# Patient Record
Sex: Male | Born: 1995 | Race: Black or African American | Hispanic: No | Marital: Single | Smoking: Current every day smoker
Health system: Southern US, Community
[De-identification: ages and names within clinical notes are randomized; demographics above are authoritative.]

## PROBLEM LIST (undated history)

## (undated) DIAGNOSIS — K029 Dental caries, unspecified: Secondary | ICD-10-CM

---
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
## 2016-07-08 ENCOUNTER — Encounter: Payer: Self-pay | Admitting: Emergency Medicine

## 2016-07-08 ENCOUNTER — Emergency Department: Payer: Self-pay

## 2016-07-08 ENCOUNTER — Emergency Department
Admission: EM | Admit: 2016-07-08 | Discharge: 2016-07-08 | Disposition: A | Discharge status: Home / Self Care | Payer: Self-pay | Attending: Emergency Medicine | Admitting: Emergency Medicine

## 2016-07-08 DIAGNOSIS — F17200 Nicotine dependence, unspecified, uncomplicated: Secondary | ICD-10-CM | POA: Insufficient documentation

## 2016-07-08 DIAGNOSIS — Y9367 Activity, basketball: Secondary | ICD-10-CM | POA: Insufficient documentation

## 2016-07-08 DIAGNOSIS — W2105XA Struck by basketball, initial encounter: Secondary | ICD-10-CM | POA: Insufficient documentation

## 2016-07-08 DIAGNOSIS — Y92310 Basketball court as the place of occurrence of the external cause: Secondary | ICD-10-CM | POA: Insufficient documentation

## 2016-07-08 DIAGNOSIS — L84 Corns and callosities: Secondary | ICD-10-CM | POA: Insufficient documentation

## 2016-07-08 DIAGNOSIS — S6991XA Unspecified injury of right wrist, hand and finger(s), initial encounter: Secondary | ICD-10-CM | POA: Insufficient documentation

## 2016-07-08 DIAGNOSIS — Y999 Unspecified external cause status: Secondary | ICD-10-CM | POA: Insufficient documentation

## 2016-07-08 MED ORDER — MELOXICAM 15 MG PO TABS: 15.0000 mg | ORAL_TABLET | Freq: Every day | ORAL | 0 refills | Status: DC

## 2016-07-08 NOTE — ED Provider Notes (Signed)
[facility] Emergency Department Provider Note  ____________________________________________  Time seen: Approximately 5:05 PM  I have reviewed the triage vital signs and the nursing notes.   HISTORY  Chief Complaint Finger Injury    HPI Jeffrey Parrish is a 20 y.o. male who presents emergency department complaining of significant pain right hand. Patient states that he was playing basketball 2 weeks prior when his finger was jammed. Patient reports that his PIP joint has been swollen since time of injury. Patient denies any pain. He endorses full range of motion. Patient is also concerned due to a "knot" on the lateral aspect of the nailbed to the third digit right hand. Patient reports that this is been present for "weeks." Patient denies any pain to area. He denies any erythema. He denies any drainage from site.   History reviewed. No pertinent past medical history.  There are no active problems to display for this patient.   History reviewed. No pertinent surgical history.  Prior to Admission medications   Medication Sig Start Date End Date Taking? Authorizing Provider  meloxicam (MOBIC) 15 MG tablet Take 1 tablet (15 mg total) by mouth daily. 07/08/16   Delorise RoyalsJonathan D Jaylun Fleener, PA-C    Allergies Review of patient's allergies indicates no known allergies.  No family history on file.  Social History Social History  Substance Use Topics  . Smoking status: Current Every Day Smoker  . Smokeless tobacco: Never Used  . Alcohol use No     Review of Systems  Constitutional: No fever/chills Cardiovascular: no chest pain. Respiratory: no cough. No SOB. Musculoskeletal: Positive for enlarged PIP joint second digit right hand. Skin: Negative for rash, abrasions, lacerations, ecchymosis. Positive for "skin knot" to lateral aspect of the nailbed and third digit right hand Neurological: Negative for headaches, focal weakness or numbness. 10-point ROS  otherwise negative.  ____________________________________________   PHYSICAL EXAM:  VITAL SIGNS: ED Triage Vitals  Enc Vitals Group     BP 07/08/16 1643 129/65     Pulse Rate 07/08/16 1643 (!) 58     Resp 07/08/16 1643 18     Temp 07/08/16 1643 97.8 F (36.6 C)     Temp Source 07/08/16 1643 Oral     SpO2 07/08/16 1643 100 %     Weight 07/08/16 1644 125 lb (56.7 kg)     Height 07/08/16 1644 5\' 9"  (1.753 m)     Head Circumference --      Peak Flow --      Pain Score 07/08/16 1648 3     Pain Loc --      Pain Edu? --      Excl. in GC? --      Constitutional: Alert and oriented. Well appearing and in no acute distress. Eyes: Conjunctivae are normal. PERRL. EOMI. Head: Atraumatic. Cardiovascular: Normal rate, regular rhythm. Normal S1 and S2.  Good peripheral circulation. Respiratory: Normal respiratory effort without tachypnea or retractions. Lungs CTAB. Good air entry to the bases with no decreased or absent breath sounds. Musculoskeletal: Full range of motion to all extremities. Visualization of the right hand reveals a edematous PIP joint second digit right hand. Full range of motion. No palpable abnormality to the osseous structures. No tenderness to palpation. Sensation and cap refill intact distal aspect of the finger. No other osseous abnormality identified to the right hand. Neurologic:  Normal speech and language. No gross focal neurologic deficits are appreciated.  Skin:  Skin is warm, dry and intact. No rash  noted. Patient has skin thickening to the lateral aspect of the nailbed third digit right hand. This is consistent with callus formation. No signs of infection with erythema, warmth, drainage. Area is nontender to palpation. Psychiatric: Mood and affect are normal. Speech and behavior are normal. Patient exhibits appropriate insight and judgement.   ____________________________________________   LABS (all labs ordered are listed, but only abnormal results are  displayed)  Labs Reviewed - No data to display ____________________________________________  EKG   ____________________________________________  RADIOLOGY Festus BarrenI, Michaelyn Wall D Kissy Cielo, personally viewed and evaluated these images (plain radiographs) as part of my medical decision making, as well as reviewing the written report by the radiologist.  Dg Hand Complete Right  Result Date: 07/08/2016 CLINICAL DATA:  Jammed right index finger playing football last week. Ingrown nail at the middle finger. EXAM: RIGHT HAND - COMPLETE 3+ VIEW COMPARISON:  None. FINDINGS: Soft tissue swelling is present at the PIP joint of the index finger. The joint is located. There is no underlying fracture or foreign body. Focal soft tissue swelling is noted along the ulnar aspect of the distal middle finger of the nail bed. There may be a soft tissue laceration with focal lucency. This could represent air underneath the cuticle. No associated osseous lesion is present. The hand is otherwise unremarkable. IMPRESSION: 1. Soft swelling the PIP joint of the index finger without underlying fracture or dislocation. 2. Focal soft tissue swelling in the distal middle finger without associated osseous abnormality. Electronically Signed   By: Marin Robertshristopher  Mattern M.D.   On: 07/08/2016 17:56    ____________________________________________    PROCEDURES  Procedure(s) performed:    Procedures    Medications - No data to display   ____________________________________________   INITIAL IMPRESSION / ASSESSMENT AND PLAN / ED COURSE  Pertinent labs & imaging results that were available during my care of the patient were reviewed by me and considered in my medical decision making (see chart for details).  Clinical Course    Patient's diagnosis is consistent with Finger injury from 2 weeks prior. X-ray reveals no acute osseous abnormality. PIP joint to the second digit is enlarged but is nonpainful and does not strict  motion. Sensation and cap refill intact distally. Patient will be placed on anti-inflammatories for reduction of edema. If after 2 weeks there is no improvement patient will follow-up with orthopedics. Patient also has what appears to be a callus to the lateral aspect of the nailbed third digit of the right hand. No signs of infection. Patient is encouraged to use moisturizing lotion or Mentor collagen breakdown to improve the symptoms..  Patient is given ED precautions to return to the ED for any worsening or new symptoms.     ____________________________________________  FINAL CLINICAL IMPRESSION(S) / ED DIAGNOSES  Final diagnoses:  Finger injury, right, initial encounter  Skin callus      NEW MEDICATIONS STARTED DURING THIS VISIT:  New Prescriptions   MELOXICAM (MOBIC) 15 MG TABLET    Take 1 tablet (15 mg total) by mouth daily.        This chart was dictated using voice recognition software/Dragon. Despite best efforts to proofread, errors can occur which can change the meaning. Any change was purely unintentional.    Racheal PatchesJonathan D Lainy Wrobleski, PA-C 07/08/16 1849    Minna AntisKevin Paduchowski, MD 07/08/16 [phone]

## 2016-07-08 NOTE — ED Triage Notes (Signed)
States he jammed right index finger while playing football  And also has a poss. Ingrown nail to middle finger

## 2016-07-20 ENCOUNTER — Emergency Department
Admission: EM | Admit: 2016-07-20 | Discharge: 2016-07-20 | Disposition: A | Discharge status: Home / Self Care | Payer: Self-pay | Attending: Emergency Medicine | Admitting: Emergency Medicine

## 2016-07-20 ENCOUNTER — Encounter: Payer: Self-pay | Admitting: Emergency Medicine

## 2016-07-20 DIAGNOSIS — F17210 Nicotine dependence, cigarettes, uncomplicated: Secondary | ICD-10-CM | POA: Insufficient documentation

## 2016-07-20 DIAGNOSIS — K0889 Other specified disorders of teeth and supporting structures: Secondary | ICD-10-CM | POA: Insufficient documentation

## 2016-07-20 MED ORDER — IBUPROFEN 600 MG PO TABS
600.0000 mg | ORAL_TABLET | Freq: Three times a day (TID) | ORAL | 0 refills | Status: DC | PRN
Start: 2016-07-20 — End: 2016-07-23

## 2016-07-20 MED ORDER — AMOXICILLIN 500 MG PO CAPS: 500.0000 mg | ORAL_CAPSULE | Freq: Three times a day (TID) | ORAL | 0 refills | Status: DC

## 2016-07-20 MED ORDER — CEFTRIAXONE SODIUM 1 G IJ SOLR
1.0000 g | Freq: Once | INTRAMUSCULAR | Status: AC
  Administered 2016-07-20: 1 g via INTRAMUSCULAR
  Filled 2016-07-20: qty 10

## 2016-07-20 NOTE — Discharge Instructions (Signed)
Follow up with one of the dental clinics listed on your papers.  Call Monday for appointment.  [facility] does not require an appointment and information is given to you also. Take Amoxil and ibuprofen. Continue fluids and soft foods as tolerated. It does appear that you're wisdom tooth is protruding from the gum which may add to your dental discomfort.  OPTIONS FOR DENTAL FOLLOW UP CARE  [place] Department of Health and Human Services - Local Safety Net Dental Clinics [email or web address]   [facility] [phone])  Sharl Ma [phone])  [place] [phone])  [facility] [phone])  [facility] [phone]) This clinic caters to the indigent population and is on a lottery system. Location: [employer] [facility], [employer], [address] [place] Clinic Hours: Wednesdays from 6pm - 9pm, patients seen by a lottery system. For dates, call or go to [email or web address] Services: Cleanings, fillings and simple extractions. Payment Options: DENTAL WORK IS FREE OF CHARGE. Bring proof of income or support. Best way to get seen: Arrive at 5:15 pm - this is a lottery, NOT first come/first serve, so arriving earlier will not increase your chances of being seen.     [facility] [phone] Select option 1 for emergencies   Location: [facility], [place], [address], [place] Clinic Hours: No walk-ins accepted - call the day before to schedule an appointment. Check in times are 9:30 am and 1:30 pm. Services: Simple extractions, temporary fillings, pulpectomy/pulp debridement, uncomplicated abscess drainage. Payment Options: PAYMENT IS DUE AT THE TIME OF SERVICE.  Fee is usually $100-200, additional surgical procedures (e.g. abscess drainage) may be extra. Cash, checks,  Visa/MasterCard accepted.  Can file Medicaid if patient is covered for dental - patient should call case worker to check. No discount for [facility] [place] patients. Best way to get seen: MUST call the day before and get onto the schedule. Can usually be seen the next 1-2 days. No walk-ins accepted.     [facility] [phone]   Location: [facility], [address], [place] Clinic Hours: M, W, Th, F 8am or 1:30pm, Tues 9a or 1:30 - first come/first served. Services: Simple extractions, temporary fillings, uncomplicated abscess drainage.  You do not need to be an [facility] resident. Payment Options: PAYMENT IS DUE AT THE TIME OF SERVICE. Dental insurance, otherwise sliding scale - bring proof of income or support. Depending on income and treatment needed, cost is usually $50-200. Best way to get seen: Arrive early as it is first come/first served.     [facility] [phone]   Location: [address] Clinic Hours: Mon-Thu 8a-5p Services: Most basic dental services including extractions and fillings. Payment Options: PAYMENT IS DUE AT THE TIME OF SERVICE. Sliding scale, up to 50% off - bring proof if income or support. Medicaid with dental option accepted. Best way to get seen: Call to schedule an appointment, can usually be seen within 2 weeks OR they will try to see walk-ins - show up at 8a or 2p (you may have to wait).     [facility] [phone] [place] RESIDENTS ONLY   Location: [facility], [address], [place], [place] [address] Clinic Hours: By appointment only. Monday - Thursday 8am-5pm, Friday 8am-12pm Services: Cleanings, fillings, extractions. Payment Options: PAYMENT IS DUE AT THE TIME OF SERVICE. Cash, Visa or MasterCard. Sliding  scale - $30 minimum per service. Best way to get seen: Come in to office, complete packet and  make an appointment - need proof of income or support monies for each household member and proof of [place] residence. Usually takes about a month to get in.     [facility] [phone]   Location: [address] [facility] Hours: Walk-in Urgent Care Dental Services are offered Monday-Friday mornings only. The numbers of emergencies accepted daily is limited to the number of providers available. Maximum 15 - Mondays, Wednesdays & Thursdays Maximum 10 - Tuesdays & Fridays Services: You do not need to be [place] resident to be seen for a dental emergency. Emergencies are defined as pain, swelling, abnormal bleeding, or dental trauma. Walkins will receive x-rays if needed. NOTE: Dental cleaning is not an emergency. Payment Options: PAYMENT IS DUE AT THE TIME OF SERVICE. Minimum co-pay is $40.00 for uninsured patients. Minimum co-pay is $3.00 for Medicaid with dental coverage. Dental Insurance is accepted and must be presented at time of visit. Medicare does not cover dental. Forms of payment: Cash, credit card, checks. Best way to get seen: If not previously registered with the clinic, walk-in dental registration begins at 7:15 am and is on a first come/first serve basis. If previously registered with the clinic, call to make an appointment.     [facility] [phone] [place] RESIDENTS ONLY   Location: [address], [place], [place] Clinic Hours: Mon-Thu 10a-2p Services: Extractions only! Payment Options: FREE (donations accepted) - bring proof of income or support Best way to get seen: Call and schedule an appointment OR come at 8am on the 1st Monday of every month (except for holidays) when it is first come/first served.     [facility] [phone]   Location: [address], [place] Clinic Hours: Friday mornings Services, Payment Options, Best way to get seen: Call for info

## 2016-07-20 NOTE — ED Triage Notes (Signed)
Patient from home via POV with complaint of left lower dental pain (wisdom tooth).

## 2016-07-20 NOTE — ED Provider Notes (Signed)
[facility] Emergency Department Provider Note   ____________________________________________   First MD Initiated Contact with Patient 07/20/16 1157     (approximate)  I have reviewed the triage vital signs and the nursing notes.   HISTORY  Chief Complaint Dental Pain   HPI Jeffrey Parrish is a 20 y.o. male 0 complaint of left lower dental pain that started yesterday. Patient has taken Tylenol for his pain. Currently he is taking an anti-inflammatory and a antibiotic for a another injury. Patient is unaware of what this antibiotic is. Patient currently does not have a dentist. Denies any fever or chills. Patient is able to drink fluids, take his medication and talk. He is unable to open his mouth completely due to dental pain. He rates his pain as an 8 out of 10.   History reviewed. No pertinent past medical history.  There are no active problems to display for this patient.   History reviewed. No pertinent surgical history.  Prior to Admission medications   Medication Sig Start Date End Date Taking? Authorizing Provider  amoxicillin (AMOXIL) 500 MG capsule Take 1 capsule (500 mg total) by mouth 3 (three) times daily. 07/20/16   Tommi Rumpshonda L Herbert Aguinaldo, PA-C  ibuprofen (ADVIL,MOTRIN) 600 MG tablet Take 1 tablet (600 mg total) by mouth every 8 (eight) hours as needed. 07/20/16   Tommi Rumpshonda L Gredmarie Delange, PA-C  meloxicam (MOBIC) 15 MG tablet Take 1 tablet (15 mg total) by mouth daily. 07/08/16   Delorise RoyalsJonathan D Cuthriell, PA-C    Allergies Review of patient's allergies indicates no known allergies.  History reviewed. No pertinent family history.  Social History Social History  Substance Use Topics  . Smoking status: Current Every Day Smoker  . Smokeless tobacco: Never Used  . Alcohol use No    Review of Systems Constitutional: No fever/chills ENT: Positive dental pain. Cardiovascular: Denies chest pain. Respiratory: Denies shortness of breath. Gastrointestinal:   No nausea, no vomiting.   Musculoskeletal: Negative for back pain. Skin: Negative for rash. Neurological: Negative for headaches, focal weakness or numbness.  10-point ROS otherwise negative.  ____________________________________________   PHYSICAL EXAM:  VITAL SIGNS: ED Triage Vitals [07/20/16 1132]  Enc Vitals Group     BP (!) 108/59     Pulse      Resp 18     Temp 98.9 F (37.2 C)     Temp src      SpO2 100 %     Weight 125 lb (56.7 kg)     Height 5\' 9"  (1.753 m)     Head Circumference      Peak Flow      Pain Score 8     Pain Loc      Pain Edu?      Excl. in GC?     Constitutional: Alert and oriented. Well appearing and in no acute distress. Eyes: Conjunctivae are normal. PERRL. EOMI. Head: Atraumatic. Nose: No congestion/rhinnorhea. Mouth/Throat: Mucous membranes are moist.  Oropharynx non-erythematous.Patient is able to open his mouth enough that I can see that on the left mandible there is interruption that appears to be his wisdom tooth. There is moderate tenderness on palpation. Posterior pharynx no erythema or exudate was seen. Patient has no difficulty with swallowing. Gum is moderately tender to palpation with a tongue depressor. Neck: No stridor.   Hematological/Lymphatic/Immunilogical: No cervical lymphadenopathy. Cardiovascular: Normal rate, regular rhythm. Grossly normal heart sounds.  Good peripheral circulation. Respiratory: Normal respiratory effort.  No retractions. Lungs CTAB. Musculoskeletal:  Moves upper and lower extremities without any difficulty. Normal gait was noted. Neurologic:  Normal speech and language. No gross focal neurologic deficits are appreciated. No gait instability. Skin:  Skin is warm, dry and intact. No rash noted. Psychiatric: Mood and affect are normal. Speech and behavior are normal.  ____________________________________________   LABS (all labs ordered are listed, but only abnormal results are displayed)  Labs Reviewed -  No data to display  PROCEDURES  Procedure(s) performed: None  Procedures  Critical Care performed: No  ____________________________________________   INITIAL IMPRESSION / ASSESSMENT AND PLAN / ED COURSE  Pertinent labs & imaging results that were available during my care of the patient were reviewed by me and considered in my medical decision making (see chart for details).    Clinical Course  Patient was given Rocephin 1 g IM to cover any dental abscesses since visualization was limited. Patient was discharged on amoxicillin and ibuprofen. He was given list of dental clinics that he is eligible for. Patient is encouraged to call and make an appointment or go to [facility] who does not require a appointment.Patient was also made aware that he is wisdom teeth left lower is visualized with  eruption.   ____________________________________________   FINAL CLINICAL IMPRESSION(S) / ED DIAGNOSES  Final diagnoses:  Pain, dental      NEW MEDICATIONS STARTED DURING THIS VISIT:  Discharge Medication List as of 07/20/2016  1:04 PM    START taking these medications   Details  amoxicillin (AMOXIL) 500 MG capsule Take 1 capsule (500 mg total) by mouth 3 (three) times daily., Starting Sat 07/20/2016, Print    ibuprofen (ADVIL,MOTRIN) 600 MG tablet Take 1 tablet (600 mg total) by mouth every 8 (eight) hours as needed., Starting Sat 07/20/2016, Print         Note:  This document was prepared using Dragon voice recognition software and may include unintentional dictation errors.    Tommi Rumps, PA-C 07/20/16 1327    Myrna Blazer, MD 07/20/16 1440

## 2016-07-23 ENCOUNTER — Emergency Department: Payer: Self-pay

## 2016-07-23 ENCOUNTER — Encounter: Payer: Self-pay | Admitting: Emergency Medicine

## 2016-07-23 ENCOUNTER — Inpatient Hospital Stay
Admission: EM | Admit: 2016-07-23 | Discharge: 2016-07-25 | DRG: 159 | Disposition: A | Discharge status: Home / Self Care | Payer: Self-pay | Attending: Internal Medicine | Admitting: Internal Medicine

## 2016-07-23 DIAGNOSIS — K029 Dental caries, unspecified: Secondary | ICD-10-CM | POA: Diagnosis present

## 2016-07-23 DIAGNOSIS — R252 Cramp and spasm: Secondary | ICD-10-CM | POA: Diagnosis present

## 2016-07-23 DIAGNOSIS — K122 Cellulitis and abscess of mouth: Secondary | ICD-10-CM | POA: Diagnosis present

## 2016-07-23 DIAGNOSIS — D509 Iron deficiency anemia, unspecified: Secondary | ICD-10-CM | POA: Diagnosis present

## 2016-07-23 DIAGNOSIS — F17200 Nicotine dependence, unspecified, uncomplicated: Secondary | ICD-10-CM | POA: Diagnosis present

## 2016-07-23 DIAGNOSIS — R1310 Dysphagia, unspecified: Secondary | ICD-10-CM | POA: Diagnosis present

## 2016-07-23 DIAGNOSIS — K047 Periapical abscess without sinus: Principal | ICD-10-CM | POA: Diagnosis present

## 2016-07-23 HISTORY — DX: Dental caries, unspecified: K02.9

## 2016-07-23 LAB — CBC WITH DIFFERENTIAL/PLATELET
BASOS PCT: 0 %
Basophils Absolute: 0 10*3/uL (ref 0–0.1)
EOS ABS: 0 10*3/uL (ref 0–0.7)
EOS PCT: 0 %
HCT: 37.6 % — ABNORMAL LOW (ref 40.0–52.0)
Hemoglobin: 12.2 g/dL — ABNORMAL LOW (ref 13.0–18.0)
LYMPHS ABS: 0.7 10*3/uL — AB (ref 1.0–3.6)
Lymphocytes Relative: 3 %
MCH: 20.6 pg — AB (ref 26.0–34.0)
MCHC: 32.4 g/dL (ref 32.0–36.0)
MCV: 63.6 fL — ABNORMAL LOW (ref 80.0–100.0)
Monocytes Absolute: 1.6 10*3/uL — ABNORMAL HIGH (ref 0.2–1.0)
Monocytes Relative: 8 %
NEUTROS PCT: 89 %
Neutro Abs: 19.1 10*3/uL — ABNORMAL HIGH (ref 1.4–6.5)
PLATELETS: 316 10*3/uL (ref 150–440)
RBC: 5.91 MIL/uL — AB (ref 4.40–5.90)
RDW: 17.6 % — ABNORMAL HIGH (ref 11.5–14.5)
WBC: 21.5 10*3/uL — AB (ref 3.8–10.6)

## 2016-07-23 LAB — BASIC METABOLIC PANEL
Anion gap: 12 (ref 5–15)
BUN: 11 mg/dL (ref 6–20)
CALCIUM: 9.4 mg/dL (ref 8.9–10.3)
CO2: 24 mmol/L (ref 22–32)
CREATININE: 0.85 mg/dL (ref 0.61–1.24)
Chloride: 100 mmol/L — ABNORMAL LOW (ref 101–111)
Glucose, Bld: 93 mg/dL (ref 65–99)
Potassium: 3.3 mmol/L — ABNORMAL LOW (ref 3.5–5.1)
Sodium: 136 mmol/L (ref 135–145)

## 2016-07-23 MED ORDER — DEXAMETHASONE SODIUM PHOSPHATE 10 MG/ML IJ SOLN
10.0000 mg | Freq: Four times a day (QID) | INTRAMUSCULAR | Status: DC
  Administered 2016-07-23 – 2016-07-25 (×6): 10 mg via INTRAVENOUS
  Filled 2016-07-23 (×6): qty 1

## 2016-07-23 MED ORDER — OXYMETAZOLINE HCL 0.05 % NA SOLN: 2.0000 | Freq: Once | NASAL | Status: DC

## 2016-07-23 MED ORDER — LIDOCAINE VISCOUS 2 % MT SOLN
OROMUCOSAL | Status: AC
  Filled 2016-07-23: qty 15

## 2016-07-23 MED ORDER — ONDANSETRON HCL 4 MG/2ML IJ SOLN
4.0000 mg | Freq: Once | INTRAMUSCULAR | Status: AC
  Administered 2016-07-23: 4 mg via INTRAVENOUS
  Filled 2016-07-23: qty 2

## 2016-07-23 MED ORDER — KETOROLAC TROMETHAMINE 15 MG/ML IJ SOLN
15.0000 mg | Freq: Four times a day (QID) | INTRAMUSCULAR | Status: DC | PRN
  Administered 2016-07-25: 15 mg via INTRAVENOUS
  Filled 2016-07-23: qty 1

## 2016-07-23 MED ORDER — ENOXAPARIN SODIUM 40 MG/0.4ML ~~LOC~~ SOLN
40.0000 mg | SUBCUTANEOUS | Status: DC
  Filled 2016-07-23: qty 0.4

## 2016-07-23 MED ORDER — ONDANSETRON HCL 4 MG PO TABS: 4.0000 mg | ORAL_TABLET | Freq: Four times a day (QID) | ORAL | Status: DC | PRN

## 2016-07-23 MED ORDER — OXYMETAZOLINE HCL 0.05 % NA SOLN
NASAL | Status: AC
  Filled 2016-07-23: qty 15

## 2016-07-23 MED ORDER — SODIUM CHLORIDE 0.9 % IV SOLN
3.0000 g | Freq: Four times a day (QID) | INTRAVENOUS | Status: DC
  Administered 2016-07-23 – 2016-07-25 (×6): 3 g via INTRAVENOUS
  Filled 2016-07-23 (×9): qty 3

## 2016-07-23 MED ORDER — POTASSIUM CHLORIDE IN NACL 20-0.9 MEQ/L-% IV SOLN
INTRAVENOUS | Status: DC
  Administered 2016-07-23 – 2016-07-24 (×2): via INTRAVENOUS
  Filled 2016-07-23 (×4): qty 1000

## 2016-07-23 MED ORDER — DEXAMETHASONE SODIUM PHOSPHATE 10 MG/ML IJ SOLN
10.0000 mg | Freq: Once | INTRAMUSCULAR | Status: AC
  Administered 2016-07-23: 10 mg via INTRAVENOUS
  Filled 2016-07-23: qty 1

## 2016-07-23 MED ORDER — FENTANYL CITRATE (PF) 100 MCG/2ML IJ SOLN
100.0000 ug | Freq: Once | INTRAMUSCULAR | Status: AC
  Administered 2016-07-23: 100 ug via INTRAVENOUS
  Filled 2016-07-23: qty 2

## 2016-07-23 MED ORDER — CHLORHEXIDINE GLUCONATE 0.12 % MT SOLN
15.0000 mL | Freq: Two times a day (BID) | OROMUCOSAL | Status: DC
  Administered 2016-07-23 – 2016-07-24 (×3): 15 mL via OROMUCOSAL
  Filled 2016-07-23 (×3): qty 15

## 2016-07-23 MED ORDER — ORAL CARE MOUTH RINSE: 15.0000 mL | Freq: Two times a day (BID) | OROMUCOSAL | Status: DC

## 2016-07-23 MED ORDER — POLYETHYLENE GLYCOL 3350 17 G PO PACK: 17.0000 g | PACK | Freq: Every day | ORAL | Status: DC | PRN

## 2016-07-23 MED ORDER — LIDOCAINE HCL 2 % EX GEL: 1.0000 "application " | Freq: Once | CUTANEOUS | Status: DC

## 2016-07-23 MED ORDER — CHLORHEXIDINE GLUCONATE 0.12 % MT SOLN: 15.0000 mL | Freq: Once | OROMUCOSAL | Status: DC

## 2016-07-23 MED ORDER — ONDANSETRON HCL 4 MG/2ML IJ SOLN: 4.0000 mg | Freq: Four times a day (QID) | INTRAMUSCULAR | Status: DC | PRN

## 2016-07-23 MED ORDER — AMPICILLIN-SULBACTAM SODIUM 3 (2-1) G IJ SOLR
3.0000 g | Freq: Once | INTRAMUSCULAR | Status: AC
  Administered 2016-07-23: 3 g via INTRAVENOUS
  Filled 2016-07-23: qty 3

## 2016-07-23 MED ORDER — SODIUM CHLORIDE 0.9 % IV BOLUS (SEPSIS)
1000.0000 mL | Freq: Once | INTRAVENOUS | Status: AC
  Administered 2016-07-23: 1000 mL via INTRAVENOUS

## 2016-07-23 NOTE — Consult Note (Signed)
Jeffrey Parrish, Jeffrey Parrish [id number] [birth year] Jeffrey LovelyNoelle McLaurin, MD  Reason for Consult: Periapical tooth abscess  HPI: 20 year old male easily seen emergency room on the 26th for left mandibular molar periapical tooth abscess. At that visit as not having significant trismus was discharged home on amoxicillin and was recommended to follow up with the dentist. Percent of to the dentist today with severe trismus significant pain and was referred back to the emergency room. Seen by Dr. Merlinda FrederickMcLaughlin who noted hot potato voice and significant trismus. I was asked to evaluate for possible abscess as well as airway issue. Upon my arrival to the emergency room he had spontaneously drained approximately 90 cc of foul-smelling pus and was feeling significantly better.  Allergies: No Known Allergies  ROS: Review of systems normal other than 12 systems except per HPI.  PMH: History reviewed. No pertinent past medical history.  FH: No family history on file.  SH:  Social History   Social History  . Marital status: Single    Spouse name: N/A  . Number of children: N/A  . Years of education: N/A   Occupational History  . Not on file.   Social History Main Topics  . Smoking status: Current Every Day Smoker  . Smokeless tobacco: Never Used  . Alcohol use No  . Drug use: Unknown  . Sexual activity: Not on file   Other Topics Concern  . Not on file   Social History Narrative  . No narrative on file    PSH: History reviewed. No pertinent surgical history.  Physical  Exam: CN 2-12 grossly intact and symmetric. EAC/TMs normal BL. Oral cavity shows what appears to be a spontaneously drained left posterior molar periapical tooth abscess. He now has moderate trismus and is able to open his mouth significantly more and prior to the spontaneous drainage. Skin warm and dry. Nasal cavity without polyps or purulence. External nose and ears without masses or lesions. EOMI, PERRLA. Neck exam shows tender adenopathy  in the left mandibular region extending back to the angle of the mandible. Thyroid normal with no masses.   A/P: Periapical tooth abscess spontaneously drained in the emergency room. Do not see a need for CT scan at this point as his symptoms are significantly improved with this drainage. I would recommend at least 24 hours of IV antibiotics with Unasyn 3 g IV every 6 hours and Decadron 10 mg IV every 8 hours. He has a prescription for amoxicillin which I recommended he continue to take at home and I have written a prescription for his mother to fill for him for Augmentin 875 by mouth twice a day for 2 weeks and a 6 day double strength Sterapred taper. I do not think he needs outpatient evaluation by ENT unless his abscess returned he does however need to see a dentist ASAP to have this tooth removed.   Terrina Docter T 07/23/2016 5:14 PM

## 2016-07-23 NOTE — ED Triage Notes (Addendum)
Right rear tooth impaction, throat swelling.  Seen by dentist today who told family that swelling may be compromising airway.  When patient drinks water, water comes up and out his nose.  Drooling noted.    Patient seen through ED 2-3 days ago and started on Amoxicillin.

## 2016-07-23 NOTE — ED Notes (Signed)
ENT MD at bedside. Patient spitting up brown pus, has a foul smell.

## 2016-07-23 NOTE — ED Notes (Addendum)
Pt states having pain in mouth for two days. Pt went to the dentist today to have a wisdom tooth on the left side removed. Pt's mother stated, "they couldn't see him, they couldn't get him to open his mouth. Pt stated prescribed Ibuprofen 600 mg is ineffective for pain management. Pt states,"It hurts to talk. I feel like there is something in my throat and I can't swallow it." Patient has chills, temp 100.0 axillary. Mother at bedside.

## 2016-07-23 NOTE — H&P (Signed)
[facility] - [place] at [facility]   PATIENT NAME: Jeffrey Parrish    MR#:  [id number]  DATE OF BIRTH:  [birth year]  DATE OF ADMISSION:  07/23/2016  PRIMARY CARE PHYSICIAN: No PCP Per Patient   REQUESTING/REFERRING PHYSICIAN: Dr. Glenetta HewMcLaurin  CHIEF COMPLAINT:   Chief Complaint  Patient presents with  . Dental Problem    HISTORY OF PRESENT ILLNESS:  Jeffrey Parrish  is a 20 y.o. male with a known history of Dental caries presents to the emergency room complaining of shortness of breath, difficulty swallowing and neck pain. Patient was also unable to open his mouth. On presentation to the emergency room patient had spontaneous rupture of the abscess. Seen by ENT Dr. Jenne CampusMcQueen. CT scan was canceled and patient given a dose of Decadron and Unasyn. Symptoms are improved but still persistent. Plan is to admit patient for IV antibiotics and IV steroids for 48 hours.   PAST MEDICAL HISTORY:   Past Medical History:  Diagnosis Date  . Dental caries     PAST SURGICAL HISTORY:  History reviewed. No pertinent surgical history.  SOCIAL HISTORY:   Social History  Substance Use Topics  . Smoking status: Current Every Day Smoker  . Smokeless tobacco: Never Used  . Alcohol use No    FAMILY HISTORY:   Family History  Problem Relation Age of Onset  . CAD Neg Hx     DRUG ALLERGIES:  No Known Allergies  REVIEW OF SYSTEMS:   Review of Systems  Constitutional: Positive for malaise/fatigue. Negative for chills, fever and weight loss.  HENT: Positive for sore throat. Negative for hearing loss and nosebleeds.   Eyes: Negative for blurred vision, double vision and pain.  Respiratory: Negative for cough, hemoptysis, sputum production, shortness of breath and wheezing.   Cardiovascular: Negative for chest pain, palpitations, orthopnea and leg swelling.  Gastrointestinal: Negative for abdominal pain, constipation, diarrhea, nausea and vomiting.  Genitourinary:  Negative for dysuria and hematuria.  Musculoskeletal: Negative for back pain, falls and myalgias.  Skin: Negative for rash.  Neurological: Positive for headaches. Negative for dizziness, tremors, sensory change, speech change, focal weakness and seizures.  Endo/Heme/Allergies: Does not bruise/bleed easily.  Psychiatric/Behavioral: Negative for depression and memory loss. The patient is not nervous/anxious.     MEDICATIONS AT HOME:   Prior to Admission medications   Not on File     VITAL SIGNS:  Blood pressure (!) 130/58, pulse 97, temperature 100 F (37.8 C), temperature source Axillary, resp. rate 18, height 5\' 9"  (1.753 m), weight 56.2 kg (124 lb), SpO2 99 %.  PHYSICAL EXAMINATION:  Physical Exam  GENERAL:  20 y.o.-year-old patient lying in the bed with no acute distress.  EYES: Pupils equal, round, reactive to light and accommodation. No scleral icterus. Extraocular muscles intact.  HEENT: Head atraumatic, normocephalic. Oropharynx and nasopharynx clear. No oropharyngeal erythema, moist oral mucosa . Pus visible in the mouth NECK:  Supple, no jugular venous distention. No thyroid enlargement, no tenderness. Swelling below left temporomandibular joint with tenderness and warmth. LUNGS: Normal breath sounds bilaterally, no wheezing, rales, rhonchi. No use of accessory muscles of respiration.  CARDIOVASCULAR: S1, S2 normal. No murmurs, rubs, or gallops.  ABDOMEN: Soft, nontender, nondistended. Bowel sounds present. No organomegaly or mass.  EXTREMITIES: No pedal edema, cyanosis, or clubbing. + 2 pedal & radial pulses b/l.   NEUROLOGIC: Cranial nerves II through XII are intact. No focal Motor or sensory deficits appreciated b/l PSYCHIATRIC: The patient is alert and oriented x  3. Good affect.  SKIN: No obvious rash, lesion, or ulcer.   LABORATORY PANEL:   CBC  Recent Labs Lab 07/23/16 1532  WBC 21.5*  HGB 12.2*  HCT 37.6*  PLT 316    ------------------------------------------------------------------------------------------------------------------  Chemistries   Recent Labs Lab 07/23/16 1532  NA 136  K 3.3*  CL 100*  CO2 24  GLUCOSE 93  BUN 11  CREATININE 0.85  CALCIUM 9.4   ------------------------------------------------------------------------------------------------------------------  Cardiac Enzymes No results for input(s): TROPONINI in the last 168 hours. ------------------------------------------------------------------------------------------------------------------  RADIOLOGY:  No results found.   IMPRESSION AND PLAN:   * Periapical tooth abscess , Left upper Discussed with ENT Dr. Jenne Campus Abscess has drained spontaneously. No need for CT scan. Continue IV Unasyn and IV Decadron for 48 hours. We'll keep patient nothing by mouth as he is unable to swallow well although improved. Can resume diet if improved tomorrow. Will need Augmentin and prednisone at discharge. Start maintenance IV fluids.  * DVT prophylaxis with Lovenox  All the records are reviewed and case discussed with ED provider. Management plans discussed with the patient, family and they are in agreement.  CODE STATUS: FULL CODE  TOTAL TIME TAKING CARE OF THIS PATIENT: 40 minutes.   Milagros Loll R M.D on 07/23/2016 at 5:47 PM  Between 7am to 6pm - Pager - [phone]  After 6pm go to [email or web address] - password [email or web address] [facility]  [facility]  Office  [phone]  CC: Primary care physician; No PCP Per Patient  Note: This dictation was prepared with Dragon dictation along with smaller phrase technology. Any transcriptional errors that result from this process are unintentional.

## 2016-07-23 NOTE — ED Provider Notes (Signed)
[facility] Emergency Department Provider Note   ____________________________________________   First MD Initiated Contact with Patient 07/23/16 1615     (approximate)  I have reviewed the triage vital signs and the nursing notes.   HISTORY  Chief Complaint Dental Problem  HPI Jeffrey Parrish is a 20 y.o. male who presents to the ER with his mother for severe oral pain; he was seen in the ER 826 and at that time had infection in his wisdom tooth and was started on amoxicillin; today he followed up with a dentist and they referred him to the ER as he is unable to open his mouth; attempting to do so also worsens the pain. He is having difficulty speaking d/t the swelling.  His mother states he has to swallow very hard to make water go down, otherwise it comes out of his nose.  He has not eaten in 5 days.   History reviewed. No pertinent past medical history.  There are no active problems to display for this patient.   History reviewed. No pertinent surgical history.  Prior to Admission medications   Medication Sig Start Date End Date Taking? Authorizing Provider  amoxicillin (AMOXIL) 500 MG capsule Take 1 capsule (500 mg total) by mouth 3 (three) times daily. 07/20/16  Yes Tommi Rumps, PA-C  ibuprofen (ADVIL,MOTRIN) 600 MG tablet Take 1 tablet (600 mg total) by mouth every 8 (eight) hours as needed. 07/20/16  Yes Tommi Rumps, PA-C    Allergies Review of patient's allergies indicates no known allergies.  No family history on file.  Social History Social History  Substance Use Topics  . Smoking status: Current Every Day Smoker  . Smokeless tobacco: Never Used  . Alcohol use No    Review of Systems Constitutional: low grade fever Eyes: No visual changes. ENT: see hpi Cardiovascular: Denies chest pain. Respiratory: Denies shortness of breath. Gastrointestinal: No abdominal pain.   10-point ROS otherwise  negative.  ____________________________________________   PHYSICAL EXAM:  VITAL SIGNS: ED Triage Vitals  Enc Vitals Group     BP 07/23/16 1528 118/72     Pulse Rate 07/23/16 1528 76     Resp 07/23/16 1528 18     Temp 07/23/16 1528 98.1 F (36.7 C)     Temp Source 07/23/16 1528 Oral     SpO2 07/23/16 1528 100 %     Weight 07/23/16 1529 124 lb (56.2 kg)     Height 07/23/16 1529 5\' 9"  (1.753 m)     Head Circumference --      Peak Flow --      Pain Score 07/23/16 1529 10     Pain Loc --      Pain Edu? --      Excl. in GC? --    Constitutional: Alert and oriented. Appears to be uncomfortable  Eyes: Conjunctivae are normal. PERRL. EOMI. Head: Atraumatic. Nose: No congestion/rhinnorhea. Mouth/Throat: Mucous membranes are moist.  Patient is unable to open his jaw more than 2 cm; there is firm swelling in the submandibular area; his tongue appears to be normal in size; he is tender to palpation in the submandibular area and over his left posterior jaw; he does seem to be breathing normally.  Has a "hot potato" voice Neck: No stridor.  Cardiovascular: Normal rate, regular rhythm. Grossly normal heart sounds.  Good peripheral circulation. Respiratory: Normal respiratory effort.  No retractions. Lungs CTAB. Gastrointestinal: Soft and nontender. No distention. No abdominal bruits. No CVA tenderness.  Musculoskeletal: No lower extremity tenderness nor edema.  No joint effusions. Neurologic:  Normal speech and language. No gross focal neurologic deficits are appreciated. No gait instability. Skin:  Skin is warm, dry and intact. No rash noted. Psychiatric: Mood and affect are normal. Speech and behavior are normal.  ____________________________________________   LABS (all labs ordered are listed, but only abnormal results are displayed)  Labs Reviewed  BASIC METABOLIC PANEL - Abnormal; Notable for the following:       Result Value   Potassium 3.3 (*)    Chloride 100 (*)    All other  components within normal limits  CBC WITH DIFFERENTIAL/PLATELET - Abnormal; Notable for the following:    WBC 21.5 (*)    RBC 5.91 (*)    Hemoglobin 12.2 (*)    HCT 37.6 (*)    MCV 63.6 (*)    MCH 20.6 (*)    RDW 17.6 (*)    Neutro Abs 19.1 (*)    Lymphs Abs 0.7 (*)    Monocytes Absolute 1.6 (*)    All other components within normal limits  CULTURE, BLOOD (ROUTINE X 2)  CULTURE, BLOOD (ROUTINE X 2)   _ Procedures  -none  ____________________________________________   INITIAL IMPRESSION / ASSESSMENT AND PLAN / ED COURSE  Pertinent labs & imaging results that were available during my care of the patient were reviewed by me and considered in my medical decision making (see chart for details).  I am concerned about a deep space oral infection. I will cover patient with Unasyn and have paged ENT and order a CT of the face for further evaluation.  Clinical Course   I d/w Dr. Jenne CampusMcQueen; ENT; he will see pt in ED.  5p-Dr. Jenne CampusMcQueen at bedside.  Abscess spontaneously erupted & pt had a large amt of drainage from his mouth.  Voice has improved as has pt's ability to open jaw, though trismus still present.  Dr. Jenne CampusMcQueen feels CT no longer needed & pt may be admitted on unasyn & decadron.  Dr. Cleotis LemaSudini/prime doc will admit.  ____________________________________________   FINAL CLINICAL IMPRESSION(S) / ED DIAGNOSES  Final diagnoses:  Dental abscess      NEW MEDICATIONS STARTED DURING THIS VISIT:  New Prescriptions   No medications on file     Note:  This document was prepared using Dragon voice recognition software and may include unintentional dictation errors.   Maurilio LovelyNoelle Haile Toppins, MD 07/23/16 [phone]

## 2016-07-24 LAB — CBC
HEMATOCRIT: 35.4 % — AB (ref 40.0–52.0)
Hemoglobin: 11.4 g/dL — ABNORMAL LOW (ref 13.0–18.0)
MCH: 20.6 pg — AB (ref 26.0–34.0)
MCHC: 32.3 g/dL (ref 32.0–36.0)
MCV: 63.8 fL — ABNORMAL LOW (ref 80.0–100.0)
Platelets: 330 10*3/uL (ref 150–440)
RBC: 5.56 MIL/uL (ref 4.40–5.90)
RDW: 17.8 % — AB (ref 11.5–14.5)
WBC: 16.9 10*3/uL — ABNORMAL HIGH (ref 3.8–10.6)

## 2016-07-24 LAB — POTASSIUM: POTASSIUM: 4.9 mmol/L (ref 3.5–5.1)

## 2016-07-24 MED ORDER — SODIUM CHLORIDE 0.9 % IV SOLN
INTRAVENOUS | Status: DC
  Administered 2016-07-24: 15:00:00 via INTRAVENOUS

## 2016-07-24 MED ORDER — ZOLPIDEM TARTRATE 5 MG PO TABS
5.0000 mg | ORAL_TABLET | Freq: Every evening | ORAL | Status: DC | PRN
  Administered 2016-07-25: 5 mg via ORAL
  Filled 2016-07-24: qty 1

## 2016-07-24 NOTE — Care Management (Signed)
Patient admitted with tooth abscess .  Patient lives independently with a roommate.  States that he is employed at [employer], but during the summer months he does not have health insurance through his employer.  Patient currently self pay.  Patients mother at bedside.  She states that she already has one prescription that needs to be picked up and "it should be $4 at [facility]".  I have informed her that if she runs into any issues obtaining the medication to please let me know.  I have provided the patient with an application to Medication Management and [facility].  RNCM following for discharge medication needs

## 2016-07-24 NOTE — Progress Notes (Signed)
07/24/2016 12:40 PM  Jeffrey Parrish, Jeffrey Parrish [id number]  Hospital day 2    Temp:  [98 F (36.7 C)-100 F (37.8 C)] 98 F (36.7 C) (08/30 0422) Pulse Rate:  [59-97] 59 (08/30 0532) Resp:  [18-20] 20 (08/30 0422) BP: (107-137)/(46-72) 116/54 (08/30 0532) SpO2:  [99 %-100 %] 100 % (08/30 0532) Weight:  [53.5 kg (118 lb)-56.2 kg (124 lb)] 53.5 kg (118 lb) (08/29 1905),     Intake/Output Summary (Last 24 hours) at 07/24/16 1240 Last data filed at 07/24/16 1239  Gross per 24 hour  Intake             1372 ml  Output              625 ml  Net              747 ml    Results for orders placed or performed during the hospital encounter of 07/23/16 (from the past 24 hour(s))  Basic metabolic panel     Status: Abnormal   Collection Time: 07/23/16  3:32 PM  Result Value Ref Range   Sodium 136 135 - 145 mmol/L   Potassium 3.3 (L) 3.5 - 5.1 mmol/L   Chloride 100 (L) 101 - 111 mmol/L   CO2 24 22 - 32 mmol/L   Glucose, Bld 93 65 - 99 mg/dL   BUN 11 6 - 20 mg/dL   Creatinine, Ser 4.090.85 0.61 - 1.24 mg/dL   Calcium 9.4 8.9 - 81.110.3 mg/dL   GFR calc non Af Amer >60 >60 mL/min   GFR calc Af Amer >60 >60 mL/min   Anion gap 12 5 - 15  CBC with Differential     Status: Abnormal   Collection Time: 07/23/16  3:32 PM  Result Value Ref Range   WBC 21.5 (H) 3.8 - 10.6 K/uL   RBC 5.91 (H) 4.40 - 5.90 MIL/uL   Hemoglobin 12.2 (L) 13.0 - 18.0 g/dL   HCT 91.437.6 (L) 78.240.0 - 95.652.0 %   MCV 63.6 (L) 80.0 - 100.0 fL   MCH 20.6 (L) 26.0 - 34.0 pg   MCHC 32.4 32.0 - 36.0 g/dL   RDW 21.317.6 (H) 08.611.5 - 57.814.5 %   Platelets 316 150 - 440 K/uL   Neutrophils Relative % 89 %   Neutro Abs 19.1 (H) 1.4 - 6.5 K/uL   Lymphocytes Relative 3 %   Lymphs Abs 0.7 (L) 1.0 - 3.6 K/uL   Monocytes Relative 8 %   Monocytes Absolute 1.6 (H) 0.2 - 1.0 K/uL   Eosinophils Relative 0 %   Eosinophils Absolute 0.0 0 - 0.7 K/uL   Basophils Relative 0 %   Basophils Absolute 0.0 0 - 0.1 K/uL  Culture, blood (routine x 2)     Status: None  (Preliminary result)   Collection Time: 07/23/16  4:28 PM  Result Value Ref Range   Specimen Description BLOOD  LEFT ARM    Special Requests      BOTTLES DRAWN AEROBIC AND ANAEROBIC  ANA 1ML AER 1ML   Culture NO GROWTH < 24 HOURS    Report Status PENDING   Culture, blood (routine x 2)     Status: None (Preliminary result)   Collection Time: 07/23/16  4:34 PM  Result Value Ref Range   Specimen Description BLOOD  LEFT ARM    Special Requests      BOTTLES DRAWN AEROBIC AND ANAEROBIC  ANA .5ML AER 6ML   Culture NO GROWTH < 24 HOURS  Report Status PENDING   CBC     Status: Abnormal   Collection Time: 07/24/16  4:38 AM  Result Value Ref Range   WBC 16.9 (H) 3.8 - 10.6 K/uL   RBC 5.56 4.40 - 5.90 MIL/uL   Hemoglobin 11.4 (L) 13.0 - 18.0 g/dL   HCT 09.8 (L) 11.9 - 14.7 %   MCV 63.8 (L) 80.0 - 100.0 fL   MCH 20.6 (L) 26.0 - 34.0 pg   MCHC 32.3 32.0 - 36.0 g/dL   RDW 82.9 (H) 56.2 - 13.0 %   Platelets 330 150 - 440 K/uL  Potassium     Status: None   Collection Time: 07/24/16  4:38 AM  Result Value Ref Range   Potassium 4.9 3.5 - 5.1 mmol/L    SUBJECTIVE:  Feeling much better, hungry  OBJECTIVE:  Oral cavity-no recurrence of abscess, still moderate trismus.  IMPRESSION:  Periapical tooth abscess-continue IV antibiotics and steroids for 24hrs.  Continue PO meds at home (I wrote paper script last night gave to mom-make sure she filled them).    PLAN:  Have given tongue depressers to use to open mouth and lessen trismus.  He will do this himself at bedside.  Needs to see dentist asap as soon as he can open his mouth.  Creta Dorame T 07/24/2016, 12:40 PM

## 2016-07-24 NOTE — Progress Notes (Signed)
[facility] - [place] at [facility]                                                                                                                                                                                            Patient Demographics   Jeffrey Parrish, is a 20 y.o. male, DOB - December 28, 1995, ZOX:[id number]  Admit date - 07/23/2016   Admitting Physician Milagros Loll, MD  Outpatient Primary MD for the patient is No PCP Per Patient   LOS - 1  Subjective:Continues to have pain in the mouth and swelling in the neck but improved Pus dranning    Review of Systems:   CONSTITUTIONAL: No documented fever. No fatigue, weakness. No weight gain, no weight loss.  EYES: No blurry or double vision.  ENT: No tinnitus. No postnasal drip. . Neck swelling RESPIRATORY: No cough, no wheeze, no hemoptysis. No dyspnea.  CARDIOVASCULAR: No chest pain. No orthopnea. No palpitations. No syncope.  GASTROINTESTINAL: No nausea, no vomiting or diarrhea. No abdominal pain. No melena or hematochezia.  GENITOURINARY: No dysuria or hematuria.  ENDOCRINE: No polyuria or nocturia. No heat or cold intolerance.  HEMATOLOGY: No anemia. No bruising. No bleeding.  INTEGUMENTARY: No rashes. No lesions.  MUSCULOSKELETAL: No arthritis. No swelling. No gout.  NEUROLOGIC: No numbness, tingling, or ataxia. No seizure-type activity.  PSYCHIATRIC: No anxiety. No insomnia. No ADD.    Vitals:   Vitals:   07/23/16 2046 07/24/16 0422 07/24/16 0532 07/24/16 1300  BP: (!) 121/54 (!) 107/46 (!) 116/54 112/63  Pulse: 67  (!) 59 64  Resp: 18 20  19   Temp: 98.9 F (37.2 C) 98 F (36.7 C)  98.1 F (36.7 C)  TempSrc: Oral Oral  Oral  SpO2: 100%  100% 99%  Weight:      Height:        Wt Readings from Last 3 Encounters:  07/23/16 53.5 kg (118 lb)  07/20/16 56.7 kg (125 lb)  07/08/16 56.7 kg (125 lb)     Intake/Output Summary (Last 24 hours) at 07/24/16 1320 Last data filed at 07/24/16 1239   Gross per 24 hour  Intake             1372 ml  Output              625 ml  Net              747 ml    Physical Exam:   GENERAL: Pleasant-appearing in no apparent distress.  HEAD, EYES, EARS, NOSE AND THROAT: Atraumatic, normocephalic. Extraocular muscles are intact. Pupils equal and reactive to light. Sclerae anicteric.  No conjunctival injection. No oro-pharyngeal erythema. Drainage from mouth NECK: Supple. Neck swelling HEART: Regular rate and rhythm,. No murmurs, no rubs, no clicks.  LUNGS: Clear to auscultation bilaterally. No rales or rhonchi. No wheezes.  ABDOMEN: Soft, flat, nontender, nondistended. Has good bowel sounds. No hepatosplenomegaly appreciated.  EXTREMITIES: No evidence of any cyanosis, clubbing, or peripheral edema.  +2 pedal and radial pulses bilaterally.  NEUROLOGIC: The patient is alert, awake, and oriented x3 with no focal motor or sensory deficits appreciated bilaterally.  SKIN: Moist and warm with no rashes appreciated.  Psych: Not anxious, depressed LN: No inguinal LN enlargement    Antibiotics   Anti-infectives    Start     Dose/Rate Route Frequency Ordered Stop   07/23/16 2300  Ampicillin-Sulbactam (UNASYN) 3 g in sodium chloride 0.9 % 100 mL IVPB     3 g 100 mL/hr over 60 Minutes Intravenous Every 6 hours 07/23/16 1744     07/23/16 1630  Ampicillin-Sulbactam (UNASYN) 3 g in sodium chloride 0.9 % 100 mL IVPB     3 g 100 mL/hr over 60 Minutes Intravenous  Once 07/23/16 1626 07/23/16 1750      Medications   Scheduled Meds: . ampicillin-sulbactam (UNASYN) IV  3 g Intravenous Q6H  . chlorhexidine  15 mL Mouth/Throat Once  . chlorhexidine  15 mL Mouth Rinse BID  . dexamethasone  10 mg Intravenous Q6H  . enoxaparin (LOVENOX) injection  40 mg Subcutaneous Q24H  . lidocaine  1 application Topical Once  . mouth rinse  15 mL Mouth Rinse q12n4p  . oxymetazoline  2 spray Each Nare Once   Continuous Infusions: . 0.9 % NaCl with KCl 20 mEq / L 100 mL/hr at  07/24/16 1019   PRN Meds:.ketorolac, ondansetron **OR** ondansetron (ZOFRAN) IV, polyethylene glycol   Data Review:   Micro Results Recent Results (from the past 240 hour(s))  Culture, blood (routine x 2)     Status: None (Preliminary result)   Collection Time: 07/23/16  4:28 PM  Result Value Ref Range Status   Specimen Description BLOOD  LEFT ARM  Final   Special Requests   Final    BOTTLES DRAWN AEROBIC AND ANAEROBIC  ANA AER   Culture NO GROWTH < 24 HOURS  Final   Report Status PENDING  Incomplete  Culture, blood (routine x 2)     Status: None (Preliminary result)   Collection Time: 07/23/16  4:34 PM  Result Value Ref Range Status   Specimen Description BLOOD  LEFT ARM  Final   Special Requests   Final    BOTTLES DRAWN AEROBIC AND ANAEROBIC  ANA . AER   Culture NO GROWTH < 24 HOURS  Final   Report Status PENDING  Incomplete    Radiology Reports Dg Hand Complete Right  Result Date: 07/08/2016 CLINICAL DATA:  Jammed right index finger playing football last week. Ingrown nail at the middle finger. EXAM: RIGHT HAND - COMPLETE 3+ VIEW COMPARISON:  None. FINDINGS: Soft tissue swelling is present at the PIP joint of the index finger. The joint is located. There is no underlying fracture or foreign body. Focal soft tissue swelling is noted along the ulnar aspect of the distal middle finger of the nail bed. There may be a soft tissue laceration with focal lucency. This could represent air underneath the cuticle. No associated osseous lesion is present. The hand is otherwise unremarkable. IMPRESSION: 1. Soft swelling the PIP joint of the index finger without underlying fracture  or dislocation. 2. Focal soft tissue swelling in the distal middle finger without associated osseous abnormality. Electronically Signed   By: Marin Robertshristopher  Mattern M.D.   On: 07/08/2016 17:56     CBC  Recent Labs Lab 07/23/16 1532 07/24/16 0438  WBC 21.5* 16.9*  HGB 12.2* 11.4*  HCT 37.6* 35.4*   PLT 316 330  MCV 63.6* 63.8*  MCH 20.6* 20.6*  MCHC 32.4 32.3  RDW 17.6* 17.8*  LYMPHSABS 0.7*  --   MONOABS 1.6*  --   EOSABS 0.0  --   BASOSABS 0.0  --     Chemistries   Recent Labs Lab 07/23/16 1532 07/24/16 0438  NA 136  --   K 3.3* 4.9  CL 100*  --   CO2 24  --   GLUCOSE 93  --   BUN 11  --   CREATININE 0.85  --   CALCIUM 9.4  --    ------------------------------------------------------------------------------------------------------------------ estimated creatinine clearance is 103.2 mL/min (by C-G formula based on SCr of 0.85 mg/dL). ------------------------------------------------------------------------------------------------------------------ No results for input(s): HGBA1C in the last 72 hours. ------------------------------------------------------------------------------------------------------------------ No results for input(s): CHOL, HDL, LDLCALC, TRIG, CHOLHDL, LDLDIRECT in the last 72 hours. ------------------------------------------------------------------------------------------------------------------ No results for input(s): TSH, T4TOTAL, T3FREE, THYROIDAB in the last 72 hours.  Invalid input(s): FREET3 ------------------------------------------------------------------------------------------------------------------ No results for input(s): VITAMINB12, FOLATE, FERRITIN, TIBC, IRON, RETICCTPCT in the last 72 hours.  Coagulation profile No results for input(s): INR, PROTIME in the last 168 hours.  No results for input(s): DDIMER in the last 72 hours.  Cardiac Enzymes No results for input(s): CKMB, TROPONINI, MYOGLOBIN in the last 168 hours.  Invalid input(s): CK ------------------------------------------------------------------------------------------------------------------ Invalid input(s): POCBNP    Assessment & Plan   * Periapical tooth abscess , Left upper Continue iv unasyn  Continue IV steroids Will need out patient dental  followup tooth removal Start clear liquid diet   * DVT prophylaxis with Lovenox      Code Status Orders        Start     Ordered   07/23/16 1746  Full code  Continuous     07/23/16 1746    Code Status History    Date Active Date Inactive Code Status Order ID Comments User Context   This patient has a current code status but no historical code status.           Consults ent  DVT Prophylaxis  Lovenox  Lab Results  Component Value Date   PLT 330 07/24/2016     Time Spent in minutes   35min  Greater than 50% of time spent in care coordination and counseling patient regarding the condition and plan of care.   Auburn BilberryPATEL, Charli Liberatore M.D on 07/24/2016 at 1:20 PM  Between 7am to 6pm - Pager - [phone]  After 6pm go to [email or web address] - password [email or web address] [facility]  [facility]   Office  [phone]

## 2016-07-25 LAB — CBC
HEMATOCRIT: 32 % — AB (ref 40.0–52.0)
HEMOGLOBIN: 10.2 g/dL — AB (ref 13.0–18.0)
MCH: 20.3 pg — ABNORMAL LOW (ref 26.0–34.0)
MCHC: 31.7 g/dL — AB (ref 32.0–36.0)
MCV: 64.1 fL — AB (ref 80.0–100.0)
Platelets: 336 10*3/uL (ref 150–440)
RBC: 5 MIL/uL (ref 4.40–5.90)
RDW: 17.5 % — ABNORMAL HIGH (ref 11.5–14.5)
WBC: 22.4 10*3/uL — ABNORMAL HIGH (ref 3.8–10.6)

## 2016-07-25 LAB — BASIC METABOLIC PANEL
ANION GAP: 4 — AB (ref 5–15)
BUN: 20 mg/dL (ref 6–20)
CHLORIDE: 108 mmol/L (ref 101–111)
CO2: 30 mmol/L (ref 22–32)
Calcium: 9.1 mg/dL (ref 8.9–10.3)
Creatinine, Ser: 0.63 mg/dL (ref 0.61–1.24)
GFR calc Af Amer: >60 mL/min (ref >60)
GLUCOSE: 138 mg/dL — AB (ref 65–99)
POTASSIUM: 4.7 mmol/L (ref 3.5–5.1)
Sodium: 142 mmol/L (ref 135–145)

## 2016-07-25 MED ORDER — CHLORHEXIDINE GLUCONATE 0.12 % MT SOLN
15.0000 mL | Freq: Two times a day (BID) | OROMUCOSAL | 0 refills | Status: AC
Start: 2016-07-25 — End: ?

## 2016-07-25 MED ORDER — AMOXICILLIN-POT CLAVULANATE 875-125 MG PO TABS: 1.0000 | ORAL_TABLET | Freq: Two times a day (BID) | ORAL | 0 refills | Status: AC

## 2016-07-25 MED ORDER — DEXAMETHASONE 4 MG PO TABS: 4.0000 mg | ORAL_TABLET | Freq: Two times a day (BID) | ORAL | 0 refills | Status: DC

## 2016-07-25 MED ORDER — PREDNISOLONE 5 MG PO TABS: 10.0000 mg | ORAL_TABLET | Freq: Every day | ORAL | 0 refills | Status: AC

## 2016-07-25 MED ORDER — OXYCODONE-ACETAMINOPHEN 5-325 MG PO TABS: 1.0000 | ORAL_TABLET | ORAL | 0 refills | Status: AC | PRN

## 2016-07-25 NOTE — Discharge Summary (Signed)
Jeffrey Parrish, 20 y.o., DOB Jan 11, 1996, MRN [id number]. Admission date: 07/23/2016 Discharge Date 07/25/2016 Primary MD No PCP Per Patient Admitting Physician Milagros LollSrikar Sudini, MD  Admission Diagnosis  Dental abscess [K04.7]  Discharge Diagnosis   Active Problems:   Oral abscess  iron def anemia        Hospital Course Jeffrey Parrish  is a 20 y.o. male with a known history of Dental caries presents to the emergency room complaining of shortness of breath, difficulty swallowing and neck pain. Patient was also unable to open his mouth. On presentation to the emergency room patient had spontaneous rupture of the abscess. Seen by ENT Dr. Jenne CampusMcQueen. CT scan was canceled and patient given a dose of Decadron and Unasyn. Patient was continued on IV antibiotic with improvement of the swelling. Patient able to swallow feeling much better denies any complaints.           Consults  ent  Significant Tests:  See full reports for all details     Dg Hand Complete Right  Result Date: 07/08/2016 CLINICAL DATA:  Jammed right index finger playing football last week. Ingrown nail at the middle finger. EXAM: RIGHT HAND - COMPLETE 3+ VIEW COMPARISON:  None. FINDINGS: Soft tissue swelling is present at the PIP joint of the index finger. The joint is located. There is no underlying fracture or foreign body. Focal soft tissue swelling is noted along the ulnar aspect of the distal middle finger of the nail bed. There may be a soft tissue laceration with focal lucency. This could represent air underneath the cuticle. No associated osseous lesion is present. The hand is otherwise unremarkable. IMPRESSION: 1. Soft swelling the PIP joint of the index finger without underlying fracture or dislocation. 2. Focal soft tissue swelling in the distal middle finger without associated osseous abnormality. Electronically Signed   By: Marin Robertshristopher  Mattern M.D.   On: 07/08/2016 17:56       Today   Subjective:   Jeffrey Bonineerrence  Parrish  Feeling better swelling now resolved  Objective:   Blood pressure (!) 104/58, pulse (!) 58, temperature 97.5 F (36.4 C), temperature source Oral, resp. rate 20, height 5\' 11"  (1.803 m), weight 118 lb (53.5 kg), SpO2 98 %.  .  Intake/Output Summary (Last 24 hours) at 07/25/16 1412 Last data filed at 07/25/16 0900  Gross per 24 hour  Intake             1663 ml  Output              200 ml  Net             1463 ml    Exam VITAL SIGNS: Blood pressure (!) 104/58, pulse (!) 58, temperature 97.5 F (36.4 C), temperature source Oral, resp. rate 20, height 5\' 11"  (1.803 m), weight 118 lb (53.5 kg), SpO2 98 %.  GENERAL:  20 y.o.-year-old patient lying in the bed with no acute distress.  EYES: Pupils equal, round, reactive to light and accommodation. No scleral icterus. Extraocular muscles intact.  HEENT: Head atraumatic, normocephalic. Oropharynx and nasopharynx clear.  NECK:  Supple, no jugular venous distention. No thyroid enlargement, no tenderness.  LUNGS: Normal breath sounds bilaterally, no wheezing, rales,rhonchi or crepitation. No use of accessory muscles of respiration.  CARDIOVASCULAR: S1, S2 normal. No murmurs, rubs, or gallops.  ABDOMEN: Soft, nontender, nondistended. Bowel sounds present. No organomegaly or mass.  EXTREMITIES: No pedal edema, cyanosis, or clubbing.  NEUROLOGIC: Cranial nerves II through XII are intact. Muscle strength  5/5 in all extremities. Sensation intact. Gait not checked.  PSYCHIATRIC: The patient is alert and oriented x 3.  SKIN: No obvious rash, lesion, or ulcer.   Data Review     CBC w Diff: Lab Results  Component Value Date   WBC 22.4 (H) 07/25/2016   HGB 10.2 (L) 07/25/2016   HCT 32.0 (L) 07/25/2016   PLT 336 07/25/2016   LYMPHOPCT 3 07/23/2016   MONOPCT 8 07/23/2016   EOSPCT 0 07/23/2016   BASOPCT 0 07/23/2016   CMP: Lab Results  Component Value Date   NA 142 07/25/2016   K 4.7 07/25/2016   CL 108 07/25/2016   CO2 30 07/25/2016    BUN 20 07/25/2016   CREATININE 0.63 07/25/2016  .  Micro Results Recent Results (from the past 240 hour(s))  Culture, blood (routine x 2)     Status: None (Preliminary result)   Collection Time: 07/23/16  4:28 PM  Result Value Ref Range Status   Specimen Description BLOOD  LEFT ARM  Final   Special Requests   Final    BOTTLES DRAWN AEROBIC AND ANAEROBIC  ANA AER   Culture NO GROWTH 2 DAYS  Final   Report Status PENDING  Incomplete  Culture, blood (routine x 2)     Status: None (Preliminary result)   Collection Time: 07/23/16  4:34 PM  Result Value Ref Range Status   Specimen Description BLOOD  LEFT ARM  Final   Special Requests   Final    BOTTLES DRAWN AEROBIC AND ANAEROBIC  ANA . AER   Culture NO GROWTH 2 DAYS  Final   Report Status PENDING  Incomplete        Code Status Orders        Start     Ordered   07/23/16 1746  Full code  Continuous     07/23/16 1746    Code Status History    Date Active Date Inactive Code Status Order ID Comments User Context   This patient has a current code status but no historical code status.          Follow-up Information    dentist .   Why:  next week           Discharge Medications     Medication List    TAKE these medications   amoxicillin-clavulanate 875-125 MG tablet Commonly known as:  AUGMENTIN Take 1 tablet by mouth 2 (two) times daily.   chlorhexidine 0.12 % solution Commonly known as:  PERIDEX 15 mLs by Mouth Rinse route 2 (two) times daily.   oxyCODONE-acetaminophen 5-325 MG tablet Commonly known as:  PERCOCET Take 1 tablet by mouth every 4 (four) hours as needed for severe pain.   prednisoLONE 5 MG Tabs tablet Take 2 tablets (10 mg total) by mouth daily. Please take predinosone as prescribed by the ent doctor          Total Time in preparing paper work, data evaluation and todays exam - 32 minutes  Auburn Bilberry M.D on 07/25/2016 at 2:12 PM  [facility]    Office  [phone]

## 2016-07-25 NOTE — Discharge Instructions (Signed)
°  DIET:  Clear liquid diet, advance as tolerated  DISCHARGE CONDITION:  Stable  ACTIVITY:  Activity as tolerated  OXYGEN:  Home Oxygen: No.   Oxygen Delivery: room air  DISCHARGE LOCATION:  home    ADDITIONAL DISCHARGE INSTRUCTION:   If you experience worsening of your admission symptoms, develop shortness of breath, life threatening emergency, suicidal or homicidal thoughts you must seek medical attention immediately by calling 911 or calling your MD immediately  if symptoms less severe.  You Must read complete instructions/literature along with all the possible adverse reactions/side effects for all the Medicines you take and that have been prescribed to you. Take any new Medicines after you have completely understood and accpet all the possible adverse reactions/side effects.   Please note  You were cared for by a hospitalist during your hospital stay. If you have any questions about your discharge medications or the care you received while you were in the hospital after you are discharged, you can call the unit and asked to speak with the hospitalist on call if the hospitalist that took care of you is not available. Once you are discharged, your primary care physician will handle any further medical issues. Please note that NO REFILLS for any discharge medications will be authorized once you are discharged, as it is imperative that you return to your primary care physician (or establish a relationship with a primary care physician if you do not have one) for your aftercare needs so that they can reassess your need for medications and monitor your lab values.

## 2016-07-25 NOTE — Progress Notes (Signed)
Patient discharge teaching given, including activity, diet, follow-up appoints, medications, and coupons for medications were given. Patient verbalized understanding of all discharge instructions. IV access was d/c'd. Vitals are stable. Skin is intact except as charted in most recent assessments. Pt to be escorted out by voulnteer, to be driven home by mother.  Jeffrey Parrish

## 2016-07-25 NOTE — Care Management (Signed)
Patient to discharge home today.  Coupons provided from [email or web address].  Out of pocket cost as follows.  From [facility] : Peridex $4, Percocet $6.05, Steroids $1992.  From [facility] in [place] Augmentim $25.03.  Patient's mother at bedside.  States that they will be able to afford these medications.  [facility] and [facility].  RNCM signing off.

## 2016-07-28 LAB — CULTURE, BLOOD (ROUTINE X 2)
CULTURE: NO GROWTH
Culture: NO GROWTH

## 2017-02-05 ENCOUNTER — Encounter: Payer: Self-pay | Admitting: Medical Oncology

## 2017-02-05 ENCOUNTER — Emergency Department
Admission: EM | Admit: 2017-02-05 | Discharge: 2017-02-05 | Disposition: A | Discharge status: Home / Self Care | Payer: Self-pay | Attending: Emergency Medicine | Admitting: Emergency Medicine

## 2017-02-05 DIAGNOSIS — R0981 Nasal congestion: Secondary | ICD-10-CM

## 2017-02-05 DIAGNOSIS — J039 Acute tonsillitis, unspecified: Secondary | ICD-10-CM | POA: Insufficient documentation

## 2017-02-05 DIAGNOSIS — F17210 Nicotine dependence, cigarettes, uncomplicated: Secondary | ICD-10-CM | POA: Insufficient documentation

## 2017-02-05 LAB — POCT RAPID STREP A: STREPTOCOCCUS, GROUP A SCREEN (DIRECT): NEGATIVE

## 2017-02-05 MED ORDER — LIDOCAINE VISCOUS 2 % MT SOLN
5.0000 mL | Freq: Four times a day (QID) | OROMUCOSAL | 0 refills | Status: AC | PRN
Start: 2017-02-05 — End: ?

## 2017-02-05 MED ORDER — FEXOFENADINE-PSEUDOEPHED ER 60-120 MG PO TB12: 1.0000 | ORAL_TABLET | Freq: Two times a day (BID) | ORAL | 0 refills | Status: AC

## 2017-02-05 MED ORDER — FIRST-DUKES MOUTHWASH MT SUSP: 10.0000 mL | Freq: Four times a day (QID) | OROMUCOSAL | 0 refills | Status: AC

## 2017-02-05 MED ORDER — AMOXICILLIN 500 MG PO CAPS: 500.0000 mg | ORAL_CAPSULE | Freq: Three times a day (TID) | ORAL | 0 refills | Status: DC

## 2017-02-05 NOTE — ED Triage Notes (Signed)
Pt reports sinus drainage and sore throat x 2 days.

## 2017-02-05 NOTE — ED Provider Notes (Signed)
[facility] Emergency Department Provider Note   ____________________________________________   First MD Initiated Contact with Patient 02/05/17 [phone]     (approximate)  I have reviewed the triage vital signs and the nursing notes.   HISTORY  Chief Complaint Sore Throat    HPI Jeffrey Parrish is a 21 y.o. male patient complaining of sinus drainage and sore throat for 2 days.Patient also complaining of difficulty with solid foods. Can tolerate fluids. Patient has low-grade fever. Patient rates his pain as 8/10. No palliative measures for his complaint.   Past Medical History:  Diagnosis Date  . Dental caries     Patient Active Problem List   Diagnosis Date Noted  . Oral abscess 07/23/2016    Past Surgical History:  Procedure Laterality Date  . SURGERY SCROTAL / TESTICULAR  2012    Prior to Admission medications   Medication Sig Start Date End Date Taking? Authorizing Provider  amoxicillin (AMOXIL) 500 MG capsule Take 1 capsule (500 mg total) by mouth 3 (three) times daily. 02/05/17   Joni Reiningonald K Costella Schwarz, PA-C  chlorhexidine (PERIDEX) 0.12 % solution 15 mLs by Mouth Rinse route 2 (two) times daily. 07/25/16   Auburn BilberryShreyang Patel, MD  Diphenhyd-Hydrocort-Nystatin (FIRST-DUKES MOUTHWASH) SUSP Use as directed 10 mLs in the mouth or throat 4 (four) times daily. Mixed with viscous lidocaine swish and swallow 02/05/17   Joni Reiningonald K Garnetta Fedrick, PA-C  fexofenadine-pseudoephedrine (ALLEGRA-D) 60-120 MG 12 hr tablet Take 1 tablet by mouth 2 (two) times daily. 02/05/17   Joni Reiningonald K Briele Lagasse, PA-C  lidocaine (XYLOCAINE) 2 % solution Use as directed 5 mLs in the mouth or throat every 6 (six) hours as needed for mouth pain. Mix with Duke moutwash for swish and swallow. 02/05/17   Joni Reiningonald K Raynetta Osterloh, PA-C  oxyCODONE-acetaminophen (PERCOCET) 5-325 MG tablet Take 1 tablet by mouth every 4 (four) hours as needed for severe pain. 07/25/16   Auburn BilberryShreyang Patel, MD  prednisoLONE 5 MG TABS tablet Take 2  tablets (10 mg total) by mouth daily. Please take predinosone as prescribed by the ent doctor 07/25/16   Auburn BilberryShreyang Patel, MD    Allergies Patient has no known allergies.  Family History  Problem Relation Age of Onset  . CAD Neg Hx     Social History Social History  Substance Use Topics  . Smoking status: Current Every Day Smoker    Types: Cigarettes  . Smokeless tobacco: Never Used  . Alcohol use No    Review of Systems Constitutional: No fever/chills Eyes: No visual changes. ENT: Nasal congestion and sore throat.  Cardiovascular: Denies chest pain. Respiratory: Denies shortness of breath. Gastrointestinal: No abdominal pain.  No nausea, no vomiting.  No diarrhea.  No constipation. Genitourinary: Negative for dysuria. Musculoskeletal: Negative for back pain. Skin: Negative for rash. Neurological: Negative for headaches, focal weakness or numbness.    ____________________________________________   PHYSICAL EXAM:  VITAL SIGNS: ED Triage Vitals  Enc Vitals Group     BP 02/05/17 0826 (!) 108/50     Pulse Rate 02/05/17 0826 82     Resp 02/05/17 0826 18     Temp 02/05/17 0826 99.6 F (37.6 C)     Temp Source 02/05/17 0826 Oral     SpO2 02/05/17 0826 99 %     Weight 02/05/17 0826 127 lb (57.6 kg)     Height 02/05/17 0826 5\' 9"  (1.753 m)     Head Circumference --      Peak Flow --  Pain Score 02/05/17 0834 8     Pain Loc --      Pain Edu? --      Excl. in GC? --     Constitutional: Alert and oriented. Well appearing and in no acute distress. Eyes: Conjunctivae are normal. PERRL. EOMI. Head: Atraumatic. Nose: Edematous nasal turbinates clear rhinorrhea. Mouth/Throat: Mucous membranes are moist.  Oropharynx non-erythematous. Edematous right tonsil with exudate. Neck: No stridor.  No cervical spine tenderness to palpation. Hematological/Lymphatic/Immunilogical: Bilateral cervical lymphadenopathy. Cardiovascular: Normal rate, regular rhythm. Grossly normal  heart sounds.  Good peripheral circulation. Respiratory: Normal respiratory effort.  No retractions. Lungs CTAB. Gastrointestinal: Soft and nontender. No distention. No abdominal bruits. No CVA tenderness. Musculoskeletal: No lower extremity tenderness nor edema.  No joint effusions. Neurologic:  Normal speech and language. No gross focal neurologic deficits are appreciated. No gait instability. Skin:  Skin is warm, dry and intact. No rash noted. Psychiatric: Mood and affect are normal. Speech and behavior are normal.  ____________________________________________   LABS (all labs ordered are listed, but only abnormal results are displayed)  Labs Reviewed  CULTURE, GROUP A STREP [facility])  POCT RAPID STREP A   ____________________________________________  EKG   ____________________________________________  RADIOLOGY   ____________________________________________   PROCEDURES  Procedure(s) performed: None  Procedures  Critical Care performed: No  ____________________________________________   INITIAL IMPRESSION / ASSESSMENT AND PLAN / ED COURSE  Pertinent labs & imaging results that were available during my care of the patient were reviewed by me and considered in my medical decision making (see chart for details).  Exudative tonsillitis. Patient advised cultures pending. Patient also has nasal congestion. Patient given discharge care instructions. Patient given a prescription for Amoxil, Duke mouthwash, Allegra-D, and viscous lidocaine. Patient given a work no. Patient [facility] no [facility] in 3-5 days.      ____________________________________________   FINAL CLINICAL IMPRESSION(S) / ED DIAGNOSES  Final diagnoses:  Tonsillitis  Nasal sinus congestion      NEW MEDICATIONS STARTED DURING THIS VISIT:  New Prescriptions   AMOXICILLIN (AMOXIL) 500 MG CAPSULE    Take 1 capsule (500 mg total) by mouth 3 (three) times  daily.   DIPHENHYD-HYDROCORT-NYSTATIN (FIRST-DUKES MOUTHWASH) SUSP    Use as directed 10 mLs in the mouth or throat 4 (four) times daily. Mixed with viscous lidocaine swish and swallow   FEXOFENADINE-PSEUDOEPHEDRINE (ALLEGRA-D) 60-120 MG 12 HR TABLET    Take 1 tablet by mouth 2 (two) times daily.   LIDOCAINE (XYLOCAINE) 2 % SOLUTION    Use as directed 5 mLs in the mouth or throat every 6 (six) hours as needed for mouth pain. Mix with Duke moutwash for swish and swallow.     Note:  This document was prepared using Dragon voice recognition software and may include unintentional dictation errors.    Joni Reining, PA-C 02/05/17 9147    Jennye Moccasin, MD 02/05/17 [phone]

## 2017-02-08 LAB — CULTURE, GROUP A STREP (THRC)

## 2018-01-23 IMAGING — DX DG HAND COMPLETE 3+V*R*
3 series · 3 of 3 positions shown · non-contrast
Comparison: None.

CLINICAL DATA: Jammed right index finger playing football last
week. Ingrown nail at the middle finger.

EXAM:
RIGHT HAND - COMPLETE 3+ VIEW

[hand ap]
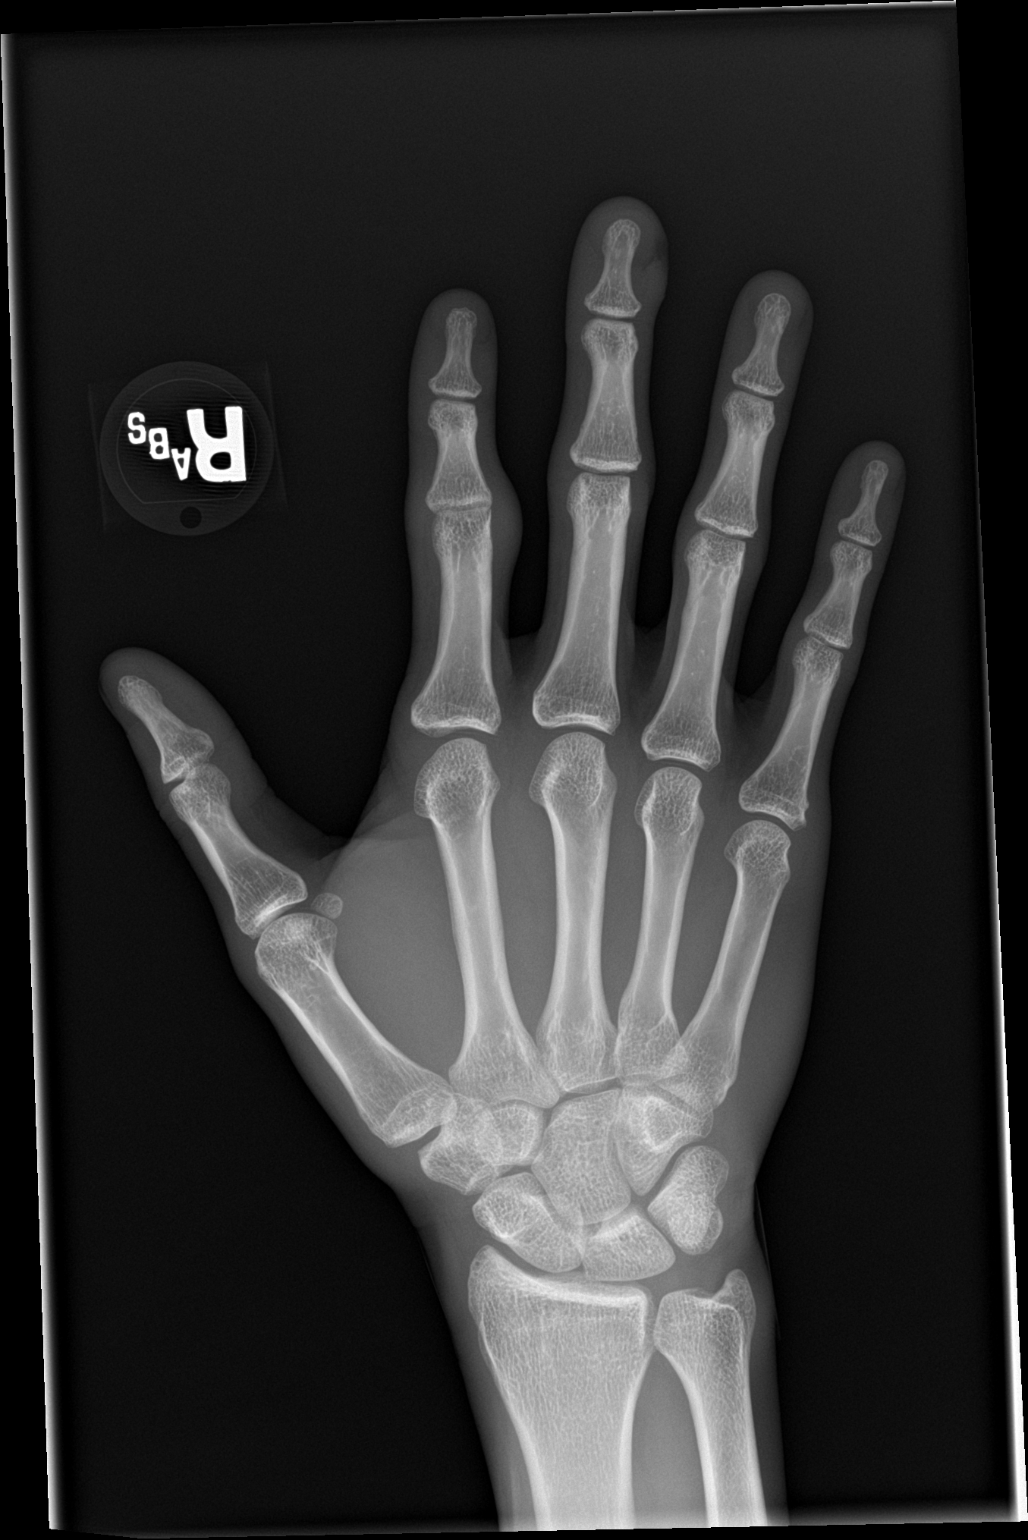

[hand obl]
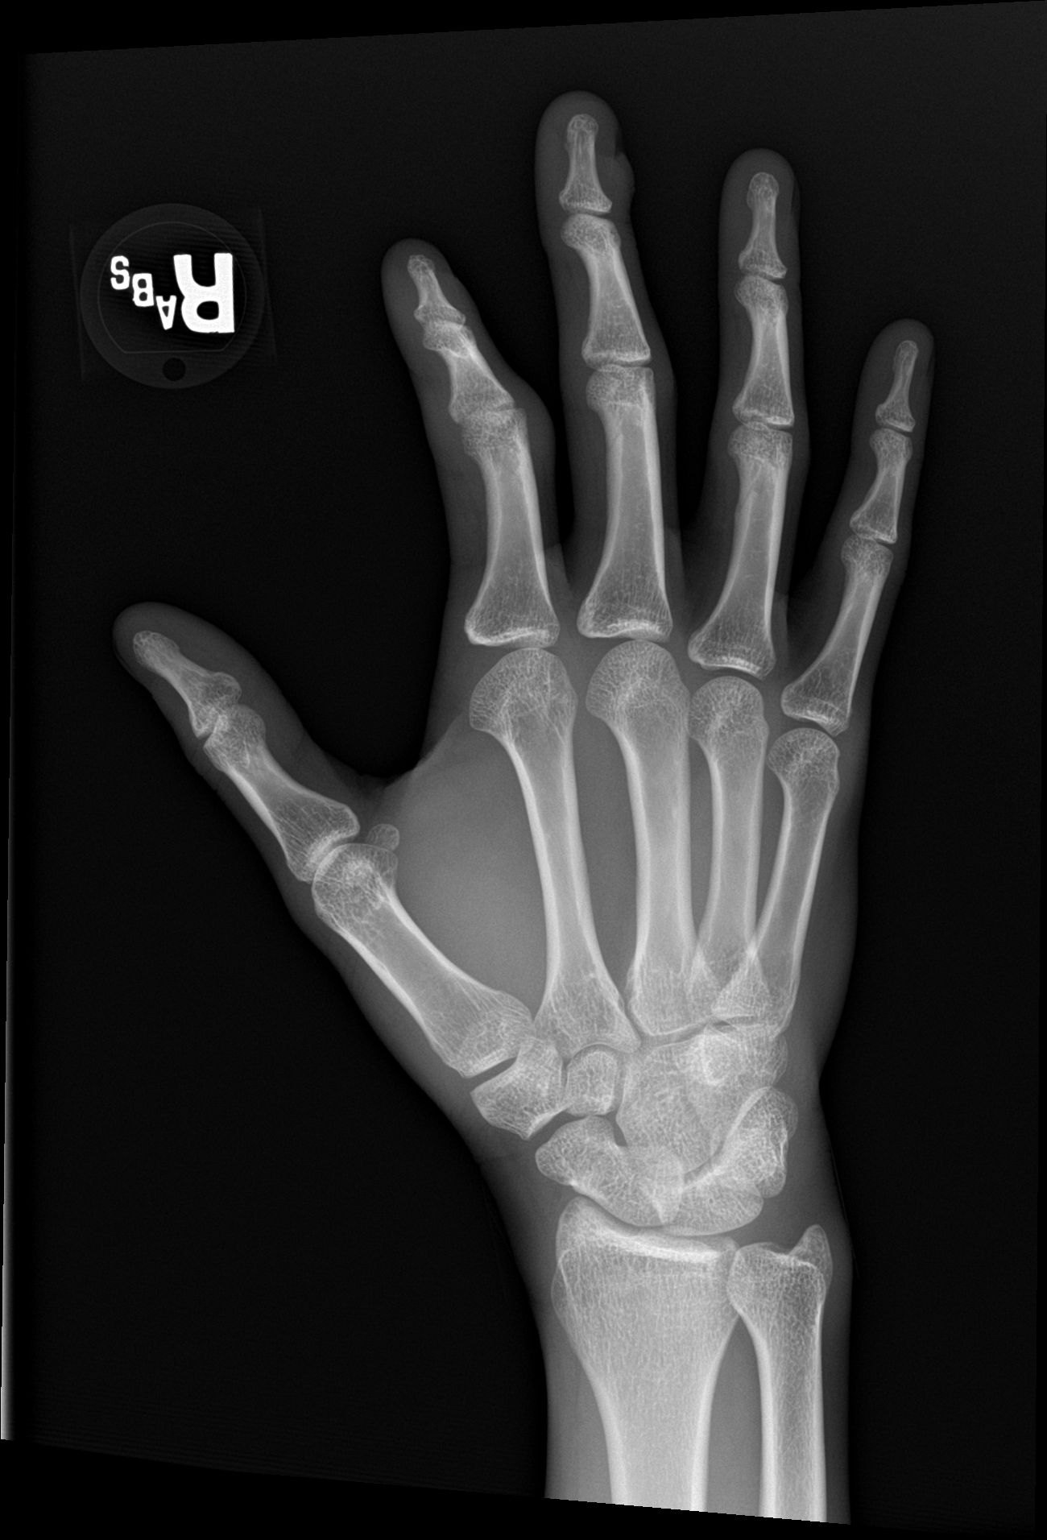

[hand lat]
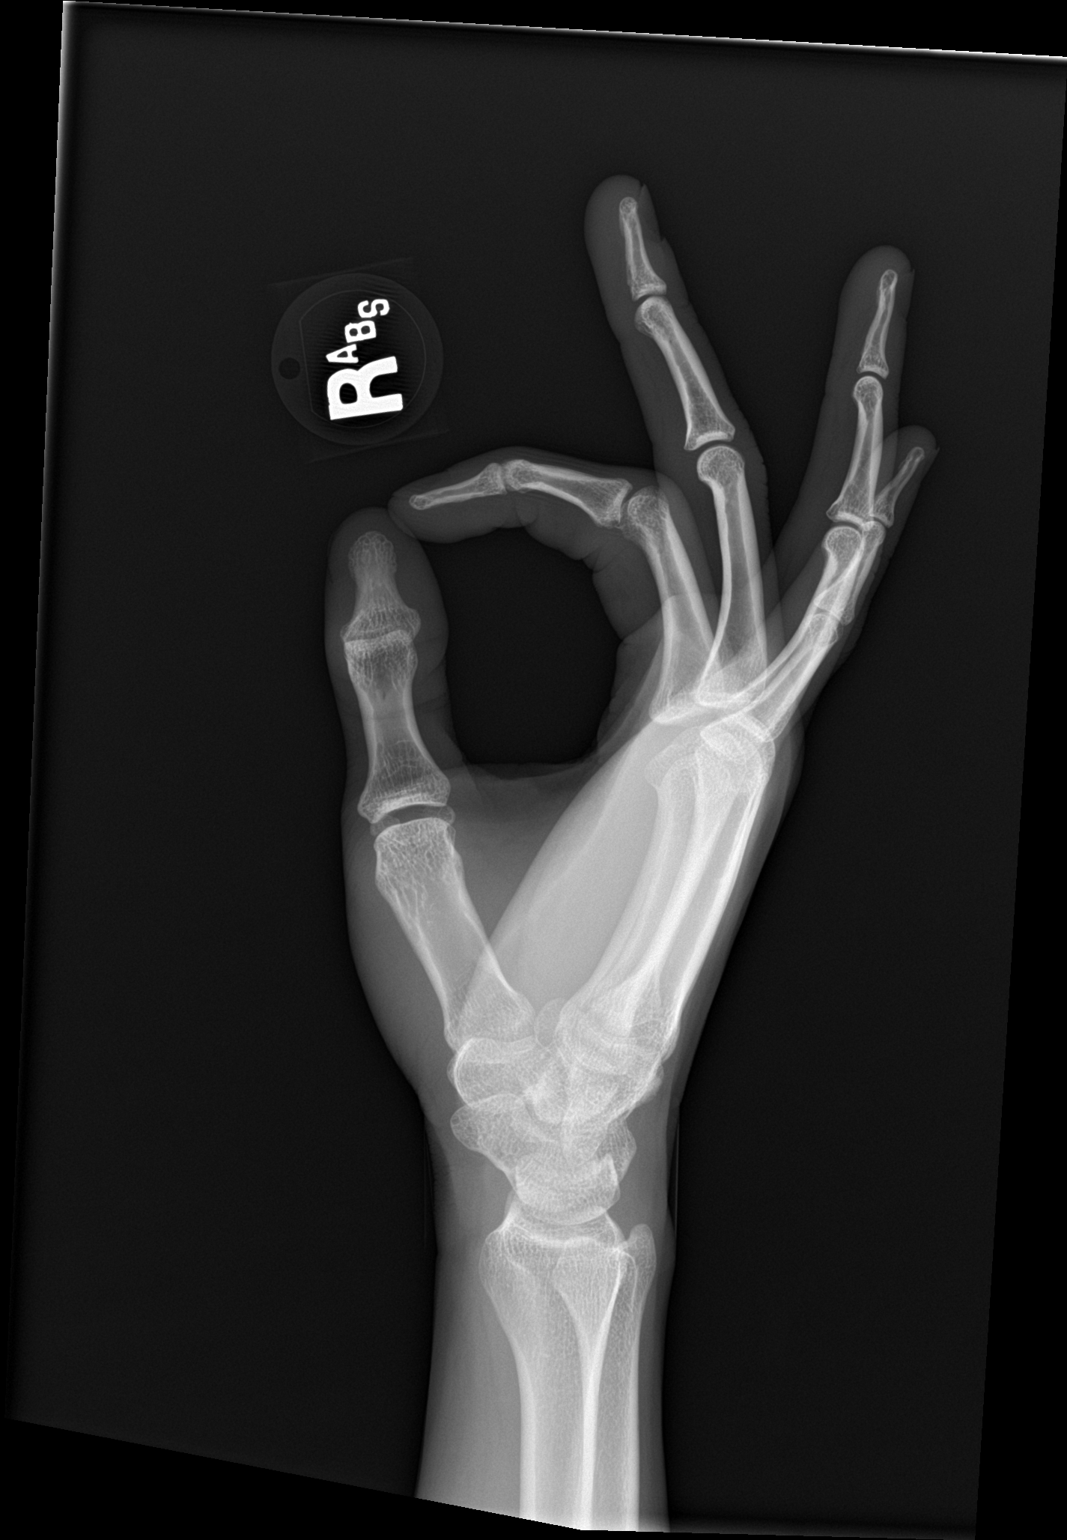

[3 of 3 positions shown; findings below may reference images not displayed]

FINDINGS: Soft tissue swelling is present at the PIP joint of the index
finger. The joint is located. There is no underlying fracture or
foreign body.

Focal soft tissue swelling is noted along the ulnar aspect of the
distal middle finger of the nail bed. There may be a soft tissue
laceration with focal lucency. This could represent air underneath
the cuticle. No associated osseous lesion is present.

The hand is otherwise unremarkable.
IMPRESSION: 1. Soft swelling the PIP joint of the index finger without
underlying fracture or dislocation.
2. Focal soft tissue swelling in the distal middle finger without
associated osseous abnormality.

## 2018-07-20 LAB — HM HIV SCREENING LAB: HM HIV Screening: NEGATIVE

## 2019-06-17 ENCOUNTER — Encounter: Payer: Self-pay | Admitting: Physician Assistant

## 2019-06-17 ENCOUNTER — Other Ambulatory Visit: Payer: Self-pay

## 2019-06-17 ENCOUNTER — Ambulatory Visit: Payer: Self-pay | Admitting: Physician Assistant

## 2019-06-17 DIAGNOSIS — Z113 Encounter for screening for infections with a predominantly sexual mode of transmission: Secondary | ICD-10-CM

## 2019-06-17 DIAGNOSIS — A5401 Gonococcal cystitis and urethritis, unspecified: Secondary | ICD-10-CM

## 2019-06-17 LAB — GRAM STAIN

## 2019-06-17 MED ORDER — CEFTRIAXONE SODIUM 250 MG IJ SOLR
250.0000 mg | Freq: Once | INTRAMUSCULAR | Status: AC
  Administered 2019-06-17: 250 mg via INTRAMUSCULAR

## 2019-06-17 MED ORDER — AZITHROMYCIN 500 MG PO TABS
1000.0000 mg | ORAL_TABLET | Freq: Once | ORAL | Status: AC
  Administered 2019-06-17: 1000 mg via ORAL

## 2019-06-17 NOTE — Progress Notes (Signed)
Patient gram stain reviewed, patient treated for gonorrhea per Glory Buff, RN

## 2019-06-17 NOTE — Progress Notes (Signed)
Patient here for STD testing, states he is having symptoms.Jenetta Downer, RN

## 2019-06-17 NOTE — Progress Notes (Signed)
    STI clinic/screening visit  Subjective:  Jeffrey Parrish is a 23 y.o. male being seen today for an STI screening visit. The patient reports they do have symptoms.  Patient has the following medical conditionshas Oral abscess on their problem list.  Chief Complaint  Patient presents with  . SEXUALLY TRANSMITTED DISEASE    Patient reports that he has been having white penile discharge and dysuria for 1.5 weeks.  Denies other symptoms.  HPI   See flowsheet for further details and programmatic requirements.    The following portions of the patient's history were reviewed and updated as appropriate: allergies, current medications, past family history, past medical history, past social history, past surgical history and problem list. Problem list updated.  Objective:  There were no vitals filed for this visit.  Physical Exam Constitutional:      General: He is not in acute distress.    Appearance: Normal appearance.  HENT:     Head: Normocephalic and atraumatic.     Mouth/Throat:     Mouth: Mucous membranes are moist.     Pharynx: Oropharynx is clear. No oropharyngeal exudate or posterior oropharyngeal erythema.  Neck:     Musculoskeletal: Neck supple.  Pulmonary:     Effort: Pulmonary effort is normal.  Abdominal:     Palpations: Abdomen is soft. There is no mass.     Tenderness: There is no abdominal tenderness. There is no guarding or rebound.  Genitourinary:    Comments: Pubic area without nits, lice, edema,erythema, lesions and inguinal adenopathy. Penis is circumcised, no edema, erythema, lesions. Yellow discharge at meatus R testicle normal; L testicle surgically absent.  Lymphadenopathy:     Cervical: No cervical adenopathy.  Skin:    General: Skin is warm and dry.     Findings: No bruising, erythema, lesion or rash.  Neurological:     Mental Status: He is alert and oriented to person, place, and time.  Psychiatric:        Mood and Affect: Mood normal.      Behavior: Behavior normal.        Thought Content: Thought content normal.        Judgment: Judgment normal.       Assessment and Plan:  Jeffrey Parrish is a 23 y.o. male presenting to the [facility] Department for STI screening  1. Screening for STD (sexually transmitted disease) Patient with symptoms today. Rec condoms with all sex  - Gram stain - azithromycin (ZITHROMAX) tablet 1,000 mg - cefTRIAXone (ROCEPHIN) injection 250 mg  2. Gonococcal urethritis in male Will treat for GC with Ceftriaxone 250mg  IM and Azithromycin 1g po DOT today No sex for 7 days and until after partner completes treatment Rec RTC if vomits < 2 hr after taking medicine - Gram stain - azithromycin (ZITHROMAX) tablet 1,000 mg - cefTRIAXone (ROCEPHIN) injection 250 mg     Return in about 3 months (around 09/17/2019) for TOC.  No future appointments.  Jerene Dilling, PA

## 2023-05-25 ENCOUNTER — Emergency Department
Admission: EM | Admit: 2023-05-25 | Discharge: 2023-05-25 | Disposition: A | Discharge status: Home / Self Care | Payer: Self-pay | Attending: Emergency Medicine | Admitting: Emergency Medicine

## 2023-05-25 ENCOUNTER — Other Ambulatory Visit: Payer: Self-pay

## 2023-05-25 DIAGNOSIS — H9202 Otalgia, left ear: Secondary | ICD-10-CM | POA: Insufficient documentation

## 2023-05-25 MED ORDER — CEPHALEXIN 500 MG PO CAPS: 500.0000 mg | ORAL_CAPSULE | Freq: Two times a day (BID) | ORAL | 0 refills | Status: AC

## 2023-05-25 NOTE — ED Provider Notes (Signed)
[facility] Emergency Department Provider Note     Event Date/Time   First MD Initiated Contact with Patient 05/25/23 1623     (approximate)   History   Abscess (Left Ear x 2 days)   HPI  Jeffrey Parrish is a 27 y.o. male presents to the ED for complaint of possible left earlobe  abscess x 2 days.  Patient reports pain and increasing swelling.  Patient has tried inserting a needle into ear but no drainage. Patient has tried warm compresses without relief.  Patient reports normally having a smaller nodule in this area but nothing compared to the size presented today.  Patient is requesting incision and drainage of his earlobe.  Denies fever, denies history of keloids. No new piercings.   Patient reports brother has keloids.    Physical Exam   Triage Vital Signs: ED Triage Vitals  Enc Vitals Group     BP 05/25/23 1528 117/72     Pulse Rate 05/25/23 1528 88     Resp 05/25/23 1528 16     Temp 05/25/23 1528 98 F (36.7 C)     Temp Source 05/25/23 1528 Oral     SpO2 05/25/23 1528 100 %     Weight 05/25/23 1529 125 lb 10.6 oz (57 kg)     Height 05/25/23 1529 5\' 9"  (1.753 m)     Head Circumference --      Peak Flow --      Pain Score 05/25/23 1528 5     Pain Loc --      Pain Edu? --      Excl. in GC? --     Most recent vital signs: Vitals:   05/25/23 1528  BP: 117/72  Pulse: 88  Resp: 16  Temp: 98 F (36.7 C)  SpO2: 100%    General Awake, no distress.  HEENT NCAT. PERRL. EOMI. No rhinorrhea.  CV:  Good peripheral perfusion.  RESP:  Normal effort.  ABD:  No distention.  Other:  Left earlobe reveals quarter size raised red lump.  Small Pinpoint scab in center. Non fluctuant.    ED Results / Procedures / Treatments   Labs (all labs ordered are listed, but only abnormal results are displayed) Labs Reviewed - No data to display  History and physical examination do not warrant a lab work up or imaging at this time.  No results  found.  PROCEDURES:  Critical Care performed: No  Procedures   MEDICATIONS ORDERED IN ED: Medications - No data to display   IMPRESSION / MDM / ASSESSMENT AND PLAN / ED COURSE  I reviewed the triage vital signs and the nursing notes.                               27 y.o. male presents to the emergency department for evaluation and treatment of a left earlobe swelling. See HPI for further details.   Differential diagnosis includes, but is not limited to abscess, keloid, perichondritis.  Patients physical exam is overall benign. I suspect the appearance of this mass clinically consistent with a keloid versus abscess needing I&D given history.  Spoke with attending Dr. Rosalia Hammers who also saw patient and agrees.  Patient is in stable condition for discharge.  Patient will be discharged with prescriptions for Keflex.  Patient is instructed to follow-up with  ear nose and throat if symptoms do not improve over the next week.  Patient's presentation is most consistent with acute complicated illness / injury requiring diagnostic workup.  FINAL CLINICAL IMPRESSION(S) / ED DIAGNOSES   Final diagnoses:  Earlobe pain, left     Rx / DC Orders   ED Discharge Orders          Ordered    cephALEXin (KEFLEX) 500 MG capsule  2 times daily        05/25/23 1721             Note:  This document was prepared using Dragon voice recognition software and may include unintentional dictation errors.    Romeo Apple, Laniah Grimm A, PA-C 05/25/23 1821    Trinna Post, MD 05/25/23 [phone]

## 2023-05-25 NOTE — ED Triage Notes (Signed)
Pt to ed from home via POV for an abscess on his left ear lobe that appears 2 days ago. Pt denies any trauma and no new piercings. Pt is caox4, in no acute distress and ambulatory in triage.

## 2023-05-31 ENCOUNTER — Other Ambulatory Visit: Payer: Self-pay

## 2023-05-31 ENCOUNTER — Emergency Department
Admission: EM | Admit: 2023-05-31 | Discharge: 2023-05-31 | Disposition: A | Discharge status: Home / Self Care | Payer: Self-pay | Attending: Emergency Medicine | Admitting: Emergency Medicine

## 2023-05-31 DIAGNOSIS — H6002 Abscess of left external ear: Secondary | ICD-10-CM

## 2023-05-31 MED ORDER — LIDOCAINE HCL (PF) 1 % IJ SOLN
5.0000 mL | Freq: Once | INTRAMUSCULAR | Status: AC
  Administered 2023-05-31: 5 mL via INTRADERMAL
  Filled 2023-05-31: qty 5

## 2023-05-31 MED ORDER — SULFAMETHOXAZOLE-TRIMETHOPRIM 800-160 MG PO TABS: 1.0000 | ORAL_TABLET | Freq: Two times a day (BID) | ORAL | 0 refills | Status: AC

## 2023-05-31 NOTE — ED Provider Notes (Signed)
[facility] Provider Note    Event Date/Time   First MD Initiated Contact with Patient 05/31/23 1527     (approximate)   History   Ear Drainage   HPI  Jeffrey Parrish is a 27 y.o. male with no PMH who is here for evaluation of an ear abscess on the left earlobe.  Patient was seen on 05/25/2023 for a similar concern.  At that time the lump was nonfluctuant and was suspected to be a keloid.  Patient stated that last night the lump burst and he was able to squeeze pus out of it.  He denies pain and vertigo.     Physical Exam   Triage Vital Signs: ED Triage Vitals  Enc Vitals Group     BP 05/31/23 1517 111/61     Pulse Rate 05/31/23 1517 74     Resp 05/31/23 1517 18     Temp 05/31/23 1517 97.6 F (36.4 C)     Temp Source 05/31/23 1517 Oral     SpO2 05/31/23 1517 98 %     Weight --      Height --      Head Circumference --      Peak Flow --      Pain Score 05/31/23 1518 0     Pain Loc --      Pain Edu? --      Excl. in GC? --     Most recent vital signs: Vitals:   05/31/23 1517  BP: 111/61  Pulse: 74  Resp: 18  Temp: 97.6 F (36.4 C)  SpO2: 98%    General: Awake, no distress.  CV:  Good peripheral perfusion.  Resp:  Normal effort.  Abd:  No distention.  Other:  2 cm lump on the left earlobe with 2 areas that have scabbed over from previous drainage, erythematous, warm and fluctuant.   ED Results / Procedures / Treatments   Labs (all labs ordered are listed, but only abnormal results are displayed) Labs Reviewed - No data to display   PROCEDURES:  Critical Care performed: No  ..Incision and Drainage  Date/Time: 05/31/2023 5:11 PM  Performed by: Cameron Ali, PA-C Authorized by: Cameron Ali, PA-C   Consent:    Consent obtained:  Verbal   Consent given by:  Patient   Risks, benefits, and alternatives were discussed: yes     Risks discussed:  Bleeding, incomplete drainage and pain   Alternatives discussed:   No treatment Universal protocol:    Patient identity confirmed:  Verbally with patient Location:    Type:  Abscess   Size:  2 cm   Location:  Head   Head location:  L external ear Pre-procedure details:    Skin preparation:  Povidone-iodine Sedation:    Sedation type:  None Anesthesia:    Anesthesia method:  Local infiltration   Local anesthetic:  Lidocaine 1% w/o epi Procedure type:    Complexity:  Simple Procedure details:    Ultrasound guidance: no     Needle aspiration: no     Incision types:  Stab incision   Incision depth:  Dermal   Wound management:  Probed and deloculated   Drainage:  Bloody   Drainage amount:  Moderate   Wound treatment:  Wound left open   Packing materials:  1/4 in iodoform gauze   Amount 1/4" iodoform:  5 inches Post-procedure details:    Procedure completion:  Tolerated well, no immediate complications  MEDICATIONS ORDERED IN ED: Medications  lidocaine (PF) (XYLOCAINE) 1 % injection 5 mL (5 mLs Intradermal Given 05/31/23 1557)     IMPRESSION / MDM / ASSESSMENT AND PLAN / ED COURSE  I reviewed the triage vital signs and the nursing notes.                             27 year old male presents for evaluation of an abscess on the left earlobe.  Differential diagnosis includes, but is not limited to, abscess, keloid, cyst.  Patient's presentation is most consistent with acute, uncomplicated illness.  Based on patient's history of the lump bursting open and fluctuance noted on physical exam I believe patient has an abscess that needs to be drained in the ED today.  I discussed draining the abscess with the patient and he is agreeable to plan.  See procedure note above.  I advised patient to leave the packing in for 2 days and to change the gauze as needed.  After 2 days he can have the packing removed.  I advised patient on wound care.  He was given a prescription for Bactrim.  I advised patient to take Tylenol and ibuprofen as needed for his  pain.  Patient given ENT follow-up information.  Patient voiced understanding, all questions were answered and he was stable at discharge      FINAL CLINICAL IMPRESSION(S) / ED DIAGNOSES   Final diagnoses:  Abscess of left earlobe     Rx / DC Orders   ED Discharge Orders          Ordered    sulfamethoxazole-trimethoprim (BACTRIM DS) 800-160 MG tablet  2 times daily        05/31/23 1715             Note:  This document was prepared using Dragon voice recognition software and may include unintentional dictation errors.   Cameron Ali, PA-C 05/31/23 1718    Merwyn Katos, MD 05/31/23 [phone]

## 2023-05-31 NOTE — Discharge Instructions (Addendum)
Please keep the area dry for 2 days and leave the packing in place.  After 2 days the packing can be removed.  You can change the gauze as needed.  Clean with soap and water daily.  Take the full course of antibiotics.  You can return to the ED with any new or worsening symptoms.

## 2023-05-31 NOTE — ED Triage Notes (Signed)
Pt was treated with antibiotics for ear infection and states his left ear "busted" and reports bleeding from ear this morning. Pt denies pain, vertigo.

## 2023-11-18 ENCOUNTER — Emergency Department
Admission: EM | Admit: 2023-11-18 | Discharge: 2023-11-18 | Disposition: A | Discharge status: Home / Self Care | Payer: Self-pay | Attending: Emergency Medicine | Admitting: Emergency Medicine

## 2023-11-18 ENCOUNTER — Other Ambulatory Visit: Payer: Self-pay

## 2023-11-18 DIAGNOSIS — R112 Nausea with vomiting, unspecified: Secondary | ICD-10-CM | POA: Insufficient documentation

## 2023-11-18 DIAGNOSIS — R197 Diarrhea, unspecified: Secondary | ICD-10-CM | POA: Insufficient documentation

## 2023-11-18 DIAGNOSIS — Z113 Encounter for screening for infections with a predominantly sexual mode of transmission: Secondary | ICD-10-CM | POA: Insufficient documentation

## 2023-11-18 LAB — CBC
HCT: 38.6 % — ABNORMAL LOW (ref 39.0–52.0)
Hemoglobin: 12.3 g/dL — ABNORMAL LOW (ref 13.0–17.0)
MCH: 20.9 pg — ABNORMAL LOW (ref 26.0–34.0)
MCHC: 31.9 g/dL (ref 30.0–36.0)
MCV: 65.6 fL — ABNORMAL LOW (ref 80.0–100.0)
Platelets: 215 10*3/uL (ref 150–400)
RBC: 5.88 MIL/uL — ABNORMAL HIGH (ref 4.22–5.81)
RDW: 18.1 % — ABNORMAL HIGH (ref 11.5–15.5)
WBC: 11.7 10*3/uL — ABNORMAL HIGH (ref 4.0–10.5)
nRBC: 0 % (ref 0.0–0.2)

## 2023-11-18 LAB — COMPREHENSIVE METABOLIC PANEL
ALT: 118 U/L — ABNORMAL HIGH (ref 0–44)
AST: 60 U/L — ABNORMAL HIGH (ref 15–41)
Albumin: 4.1 g/dL (ref 3.5–5.0)
Alkaline Phosphatase: 96 U/L (ref 38–126)
Anion gap: 10 (ref 5–15)
BUN: 15 mg/dL (ref 6–20)
CO2: 25 mmol/L (ref 22–32)
Calcium: 9.3 mg/dL (ref 8.9–10.3)
Chloride: 99 mmol/L (ref 98–111)
Creatinine, Ser: 0.82 mg/dL (ref 0.61–1.24)
GFR, Estimated: >60 mL/min (ref >60)
Glucose, Bld: 105 mg/dL — ABNORMAL HIGH (ref 70–99)
Potassium: 3.7 mmol/L (ref 3.5–5.1)
Sodium: 134 mmol/L — ABNORMAL LOW (ref 135–145)
Total Bilirubin: 2.3 mg/dL — ABNORMAL HIGH (ref <1.2)
Total Protein: 8.2 g/dL — ABNORMAL HIGH (ref 6.5–8.1)

## 2023-11-18 LAB — CHLAMYDIA/NGC RT PCR (ARMC ONLY)
Chlamydia Tr: NOT DETECTED
N gonorrhoeae: NOT DETECTED

## 2023-11-18 LAB — LIPASE, BLOOD: Lipase: 19 U/L (ref 11–51)

## 2023-11-18 MED ORDER — ONDANSETRON 4 MG PO TBDP: 4.0000 mg | ORAL_TABLET | Freq: Once | ORAL | Status: DC

## 2023-11-18 MED ORDER — ONDANSETRON HCL 4 MG/2ML IJ SOLN
4.0000 mg | Freq: Once | INTRAMUSCULAR | Status: AC
  Administered 2023-11-18: 4 mg via INTRAVENOUS
  Filled 2023-11-18: qty 2

## 2023-11-18 MED ORDER — ONDANSETRON HCL 4 MG PO TABS: 4.0000 mg | ORAL_TABLET | Freq: Three times a day (TID) | ORAL | 0 refills | Status: AC | PRN

## 2023-11-18 NOTE — ED Triage Notes (Signed)
Pt to ED via pov c/o generalized abd pain since Saturday and diarrhea today. Pt reports he was very intoxicated Friday and has been feeling sick since and had unprotected sex on Wednesday and would like to be checked for STDs. Pt denies any discharge or urinary symptoms.

## 2023-11-18 NOTE — ED Provider Notes (Signed)
[facility] Provider Note    Event Date/Time   First MD Initiated Contact with Patient 11/18/23 2103     (approximate)   History   Abdominal Pain   HPI  Jeffrey Parrish is a 27 y.o. male   Past medical history of healthy young man who presents to the emergency department requesting STI testing.  He has no dysuria frequency or penile discharge but he did have a new sexual contact earlier last week.  No testicular pain.  He called the health department for testing as an outpatient but they are closed for the holiday so came here for testing side.  Over the weekend he drank a significant mount of alcohol and had some nausea vomiting diarrhea the morning after.  The symptoms have mostly resolved.     Physical Exam   Triage Vital Signs: ED Triage Vitals  Encounter Vitals Group     BP 11/18/23 1953 124/69     Systolic BP Percentile --      Diastolic BP Percentile --      Pulse Rate 11/18/23 1953 86     Resp 11/18/23 1953 18     Temp 11/18/23 1953 98.5 F (36.9 C)     Temp Source 11/18/23 1953 Oral     SpO2 11/18/23 1953 100 %     Weight --      Height --      Head Circumference --      Peak Flow --      Pain Score 11/18/23 1958 7     Pain Loc --      Pain Education --      Exclude from Growth Chart --     Most recent vital signs: Vitals:   11/18/23 1953  BP: 124/69  Pulse: 86  Resp: 18  Temp: 98.5 F (36.9 C)  SpO2: 100%    General: Awake, no distress.  CV:  Good peripheral perfusion.  Resp:  Normal effort.  Abd:  No distention.  Other:  Awake alert comfortable appearing with normal vital signs.  Soft benign abdominal exam to deep palpation all quadrants.  Deferred genital exam given no reported rashes, or testicular pain.   ED Results / Procedures / Treatments   Labs (all labs ordered are listed, but only abnormal results are displayed) Labs Reviewed  COMPREHENSIVE METABOLIC PANEL - Abnormal; Notable for the following  components:      Result Value   Sodium 134 (*)    Glucose, Bld 105 (*)    Total Protein 8.2 (*)    AST 60 (*)    ALT 118 (*)    Total Bilirubin 2.3 (*)    All other components within normal limits  CBC - Abnormal; Notable for the following components:   WBC 11.7 (*)    RBC 5.88 (*)    Hemoglobin 12.3 (*)    HCT 38.6 (*)    MCV 65.6 (*)    MCH 20.9 (*)    RDW 18.1 (*)    All other components within normal limits  CHLAMYDIA/NGC RT PCR ([facility] ONLY)            LIPASE, BLOOD     I ordered and reviewed the above labs they are notable for mild leukocytosis 11.7.  Otherwise cell counts electrolytes unremarkable.  ALT and AST mildly elevated as is bilirubin    PROCEDURES:  Critical Care performed: No  Procedures   MEDICATIONS ORDERED IN ED: Medications  ondansetron (ZOFRAN) injection  4 mg (4 mg Intravenous Given 11/18/23 2207)     IMPRESSION / MDM / ASSESSMENT AND PLAN / ED COURSE  I reviewed the triage vital signs and the nursing notes.                                Patient's presentation is most consistent with acute presentation with potential threat to life or bodily function.  Differential diagnosis includes, but is not limited to, encounter for STI screening, intra-abdominal infection with appendicitis or cholecystitis or viral gastroenteritis    MDM:      He is here for STI screening.  Urine sent for STD testing including GC and chlamydia.  He will follow-up with MyChart for results.  Again referral to primary doctor to do annual screenings including further STI screening as needed HIV syphilis etc.    In terms of his nausea vomiting and loose stools after heavy drinking that is mostly resolved I doubt surgical abdominal pathology given benign abdominal exam.  Mildly elevated LFTs most likely in setting of alcohol use and vomiting.  Anticipatory guidance given, return precautions given.  STD testing sent.  Patient stable.  Follow-up with PMD, referral given.   Return precautions given.      FINAL CLINICAL IMPRESSION(S) / ED DIAGNOSES   Final diagnoses:  Nausea vomiting and diarrhea  Routine screening for STI (sexually transmitted infection)     Rx / DC Orders   ED Discharge Orders          Ordered    ondansetron (ZOFRAN) 4 MG tablet  Every 8 hours PRN        11/18/23 2158    Ambulatory Referral to Primary Care (Establish Care)        11/18/23 2158             Note:  This document was prepared using Dragon voice recognition software and may include unintentional dictation errors.    Pilar Jarvis, MD 11/18/23 2219

## 2023-11-18 NOTE — Discharge Instructions (Signed)
Take Zofran as needed for nausea. Drink plenty of fluids to stay well-hydrated.  Find Pedialyte or similar electrolyte rehydration formulas at your local pharmacy.  Check MyChart for the results of your gonorrhea/chlamydia testing.  I made a referral to a primary doctor who can establish care with you and do annual visits and further screening test as needed.  Talk to them about getting an HIV or syphilis testing in the future.

## 2024-11-04 ENCOUNTER — Encounter: Payer: Self-pay | Admitting: Family Medicine

## 2024-11-04 ENCOUNTER — Ambulatory Visit: Payer: Self-pay | Admitting: Family Medicine

## 2024-11-04 DIAGNOSIS — N341 Nonspecific urethritis: Secondary | ICD-10-CM

## 2024-11-04 DIAGNOSIS — Z113 Encounter for screening for infections with a predominantly sexual mode of transmission: Secondary | ICD-10-CM

## 2024-11-04 LAB — GRAM STAIN

## 2024-11-04 LAB — HM HIV SCREENING LAB: HM HIV Screening: NEGATIVE

## 2024-11-04 MED ORDER — DOXYCYCLINE HYCLATE 100 MG PO TABS: 100.0000 mg | ORAL_TABLET | Freq: Two times a day (BID) | ORAL | Status: AC

## 2024-11-04 NOTE — Progress Notes (Signed)
 [facility] Department STI clinic [address] [place] [address] Main phone: [phone]  STI screening visit  Subjective:  Jeffrey Parrish is a 28 y.o. male being seen today for an STI screening visit. The patient reports they do have symptoms.    Patient has the following medical conditions:  Patient Active Problem List   Diagnosis Date Noted   Oral abscess 07/23/2016   Chief Complaint  Patient presents with   SEXUALLY TRANSMITTED DISEASE    HPI Patient reports to clinic for STI testing. Reports penile discharge x 1 week   Reproductive considerations: Does the patient or their partner desires a pregnancy in the next year? No  See flowsheet for further details and programmatic requirements  Hyperlink available at the top of the signed note in blue.  Flow sheet content below:  Pregnancy Intention Screening Does the patient want to become pregnant in the next year?: N/A Does the patient's partner want to become pregnant in the next year?: Yes Would the patient like to discuss contraceptive options today?: N/A All Patients Anyone smoke around pt and/or pt's children?: Yes Anyone smoke inside pt's house?: Yes Anyone smoke inside car?: Yes Anyone smoke inside the workplace?: Yes Reason For STD Screen STD Screening: Has symptoms Have you ever had an STD?: Yes History of Antibiotic use in the past 2 weeks?: No STD Symptoms Discharge: Yes Discharge s/s: 1 week Risk Factors for Hep B Household, sexual, or needle sharing contact of a person infected with Hep B: No Sexual contact with a person who uses drugs not as prescribed?: No Currently or Ever used drugs not as prescribed: No HIV Positive: No PRep Patient: No Men who have sex with men: No Have Hepatitis C: No History of Incarceration: No History of Homeslessness?: No Anal sex following anal drug use?: No Risk Factors for Hep C Currently using drugs not as  prescribed: No Sexual partner(s) currently using drugs as not prescribed: No History of drug use: No HIV Positive: No People with a history of incarceration: No People born between the years of 65 and 53: No Advise Advised client to quit or stay quit. : Yes Assess # of cigarettes per day:  (occasional) Is patient ready to quit in next 30 days?: No (declines information) Abuse History Has patient ever been abused physically?: No Has patient ever been abused sexually?: No Does patient feel they have a problem with Anxiety?: No Does patient feel they have a problem with Depression?: Yes Referral to Behavioral Health: Declined Counseling Patient counseled to use condoms with all sex: Condoms given RTC in 2-3 weeks for test results: Yes Clinic will call if test results abnormal before test result appt.: Yes Test results given to patient Patient counseled to use condoms with all sex: Condoms given  Screening for MPX risk:  Unexplained rash?  No   MSM?  No   Multiple or anonymous sex partners?  No   Any close or sexual contact with a person  diagnosed with MPX?  No   Any outside the [place]  where MPX is endemic?  No   High clinical suspicion for MPX?    -Unlikely to be chickenpox    -Lymphadenopathy    -Rash that presents in same phase of       evolution on any given body part  No   Does this patient meet CDC recommendations for vaccination against MPOX? No  You already have or anticipate having the following risks:  Your sex partner has the following risks: You're traveling to a county with a clade I MPOX outbreak and anticipate these risks: Occupational exposure  You had known or suspected exposure to someone with monkeypox You had a sex partner in the past 2 weeks who was diagnosed with monkeypox You are a gay, bisexual, or other man who has sex with men, or are transgender or nonbinary and in the past 6 months have had any of the following: - A new diagnosis of one or more  sexually transmitted diseases (e.g., chlamydia, gonorrhea, or syphilis) - More than one sex partner You have had any of the following in the past 6 months: - Sex at a commercial sex venue (like a sex club or bathhouse) - Sex related to a large commercial event   or in a geographic area (city or county for example) where mpox virus transmission is occurring Sex with a new partner Sex at a commercial sex venue (e.g., a sex club or bathhouse) Sex in it consultant for money, goods, drugs, or other trade Sex in association with a large public event (e.g., a rave, party, or festival) i.e. certain people who work in a laboratory or healthcare facility   Infectious disease screenings: Vaccinated against HPV? Unknown  HIV Ever had a positive? No Last test: 2019 Results in chart:  Lab Results  Component Value Date   HMHIVSCREEN Negative - Validated 07/20/2018   No results found for: HIV   Hep B Hep B status: unknown or no prior testing Received HBV vaccination? No Received HBV testing for immunity? Unknown Results in chart:  No components found for: HMHEPBSCREEN  Do they qualify for HBV screening today? No Criteria:  -Household, sexual or needle sharing contact with HBV -History of drug use or homelessness -HIV positive -Those with known Hep C  Hep C Hep C status: unknown or no prior testing Results in chart:  No results found for: HMHEPCSCREEN No components found for: HEPC  Do they qualify for HCV screening today? No Criteria - since the last HCV result, does the patient have any of the following? - Current drug use - Have a partner with drug use - Has been incarcerated  Immunization history:   There is no immunization history on file for this patient.  The following portions of the patient's history were reviewed and updated as appropriate: allergies, current medications, past medical history, past social history, past surgical history and problem list.  Substance use  screenings:  Uses tobacco products? Yes Uses vapes? Yes Uses alcohol? Yes Uses non-injectable substances that alter your mental status? No Uses non-prescribed injectable substances? No   There is no immunization history on file for this patient.  The following portions of the patient's history were reviewed and updated as appropriate: allergies, current medications, past medical history, past social history, past surgical history and problem list.  Objective:  There were no vitals filed for this visit.  Physical Exam Exam conducted with a chaperone present Brett Orange CNA).  Constitutional:      Appearance: Normal appearance.  HENT:     Head: Normocephalic and atraumatic.     Comments: No nits or hair loss on scalp, brows, or lashes    Mouth/Throat:     Mouth: Mucous membranes are moist. No oral lesions.     Pharynx: Oropharynx is clear. No oropharyngeal exudate or posterior oropharyngeal erythema.  Eyes:     General:        Right eye: No discharge.  Left eye: No discharge.     Conjunctiva/sclera: Conjunctivae normal.     Right eye: Right conjunctiva is not injected. No exudate.    Left eye: Left conjunctiva is not injected. No exudate. Pulmonary:     Effort: Pulmonary effort is normal.  Abdominal:     Palpations: There is no hepatomegaly.     Tenderness: There is no abdominal tenderness. There is no rebound.  Genitourinary:    Pubic Area: No rash or pubic lice (no nits).      Penis: Normal. No discharge or lesions.      Testes: Normal.     Epididymis:     Right: Normal. No mass or tenderness.     Left: Normal. No mass or tenderness.     Rectum: Tenderness: no lesions or discharge.     Comments: Penile Discharge Amount: small Color:  yellow Lymphadenopathy:     Cervical: No cervical adenopathy.     Upper Body:     Right upper body: No supraclavicular, axillary or epitrochlear adenopathy.     Left upper body: No supraclavicular, axillary or epitrochlear  adenopathy.     Lower Body: No right inguinal adenopathy. No left inguinal adenopathy.  Skin:    General: Skin is warm and dry.     Findings: No lesion or rash.  Neurological:     Mental Status: He is alert and oriented to person, place, and time.      Assessment and Plan:  Jeffrey Parrish is a 28 y.o. male presenting to the [facility] Department for STI screening.  Patient accepted the following screenings: penile gram stain for GC, urine CT/GC, HIV, and RPR  1. Screening for venereal disease (Primary)  - HIV [place] [facility] - Syphilis Serology, [facility] - Gram stain - Chlamydia/GC NAA, Confirmation  2. NGU (nongonococcal urethritis) The patient was dispensed Doxycycline today. I provided counseling today regarding the medication. We discussed the medication, the side effects and when to call clinic. Patient given the opportunity to ask questions. Questions answered.   - doxycycline (VIBRA-TABS) 100 MG tablet; Take 1 tablet (100 mg total) by mouth 2 (two) times daily for 7 days.    Counseling: Recommended condom use with all sex Discussed importance of condom use for STI prevention Discussed time line for [facility] results and that patient will be called with positive results and encouraged patient to call if they had not heard in 2 weeks Recommended repeat testing in 3 months with positive results. Recommended returning for continued or worsening symptoms.   Return if symptoms worsen or fail to improve, for STI screening.  No future appointments.  Verneta Bers, [place]

## 2024-11-05 ENCOUNTER — Ambulatory Visit: Payer: Self-pay | Admitting: Family Medicine

## 2024-11-12 LAB — C. TRACHOMATIS NAA, CONFIRM: C. trachomatis NAA, Confirm: POSITIVE — AB

## 2024-11-12 LAB — CHLAMYDIA/GC NAA, CONFIRMATION
Chlamydia trachomatis, NAA: POSITIVE — AB
Neisseria gonorrhoeae, NAA: NEGATIVE

## 2024-11-12 NOTE — Progress Notes (Signed)
 Attempted to contact pt regarding positive test results from 11/04/24 chlamydia. Pt was dispensed Doxycycline  100mg  #14 for NGU (nongonococcal urethritis) that day seen in office. LMTRC to f/u with pt . Awaiting return call.

## 2024-11-22 NOTE — Telephone Encounter (Signed)
 Attempted to contact pt. No answer. LMTRC. Also sent myChart message to patient.

## 2024-11-24 NOTE — Progress Notes (Signed)
 Pt has read MyChart message. Was advised to call with any questions or concerns.
